# Patient Record
Sex: Female | Born: 1970 | Race: White | Hispanic: No | Marital: Married | Smoking: Never smoker
Health system: Southern US, Community
[De-identification: ages and names within clinical notes are randomized; demographics above are authoritative.]

## PROBLEM LIST (undated history)

## (undated) DIAGNOSIS — Z973 Presence of spectacles and contact lenses: Secondary | ICD-10-CM

## (undated) DIAGNOSIS — K589 Irritable bowel syndrome without diarrhea: Secondary | ICD-10-CM

## (undated) DIAGNOSIS — R29898 Other symptoms and signs involving the musculoskeletal system: Secondary | ICD-10-CM

## (undated) DIAGNOSIS — E039 Hypothyroidism, unspecified: Secondary | ICD-10-CM

## (undated) DIAGNOSIS — D649 Anemia, unspecified: Secondary | ICD-10-CM

## (undated) DIAGNOSIS — F419 Anxiety disorder, unspecified: Secondary | ICD-10-CM

## (undated) DIAGNOSIS — I878 Other specified disorders of veins: Secondary | ICD-10-CM

## (undated) DIAGNOSIS — E0590 Thyrotoxicosis, unspecified without thyrotoxic crisis or storm: Secondary | ICD-10-CM

## (undated) DIAGNOSIS — R1310 Dysphagia, unspecified: Secondary | ICD-10-CM

## (undated) DIAGNOSIS — K219 Gastro-esophageal reflux disease without esophagitis: Secondary | ICD-10-CM

## (undated) DIAGNOSIS — R519 Headache, unspecified: Secondary | ICD-10-CM

## (undated) DIAGNOSIS — J45909 Unspecified asthma, uncomplicated: Secondary | ICD-10-CM

## (undated) DIAGNOSIS — S92301A Fracture of unspecified metatarsal bone(s), right foot, initial encounter for closed fracture: Secondary | ICD-10-CM

## (undated) DIAGNOSIS — G709 Myoneural disorder, unspecified: Secondary | ICD-10-CM

## (undated) DIAGNOSIS — N201 Calculus of ureter: Secondary | ICD-10-CM

## (undated) DIAGNOSIS — K429 Umbilical hernia without obstruction or gangrene: Secondary | ICD-10-CM

## (undated) DIAGNOSIS — M222X9 Patellofemoral disorders, unspecified knee: Secondary | ICD-10-CM

## (undated) DIAGNOSIS — M810 Age-related osteoporosis without current pathological fracture: Secondary | ICD-10-CM

## (undated) DIAGNOSIS — Z8719 Personal history of other diseases of the digestive system: Secondary | ICD-10-CM

## (undated) DIAGNOSIS — M1990 Unspecified osteoarthritis, unspecified site: Secondary | ICD-10-CM

## (undated) DIAGNOSIS — E0500 Thyrotoxicosis with diffuse goiter without thyrotoxic crisis or storm: Secondary | ICD-10-CM

## (undated) HISTORY — DX: Gastro-esophageal reflux disease without esophagitis: K21.9

## (undated) HISTORY — PX: APPENDECTOMY: SHX54

## (undated) HISTORY — DX: Umbilical hernia without obstruction or gangrene: K42.9

## (undated) HISTORY — DX: Anxiety disorder, unspecified: F41.9

## (undated) HISTORY — DX: Thyrotoxicosis, unspecified without thyrotoxic crisis or storm: E05.90

---
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
## 1997-04-13 ENCOUNTER — Inpatient Hospital Stay (HOSPITAL_COMMUNITY): Admission: AD | Admit: 1997-04-13 | Discharge: 1997-04-13 | Payer: Self-pay | Admitting: Gynecology

## 1997-05-25 ENCOUNTER — Inpatient Hospital Stay (HOSPITAL_COMMUNITY): Admission: AD | Admit: 1997-05-25 | Discharge: 1997-05-25 | Payer: Self-pay | Admitting: Gynecology

## 1997-09-06 ENCOUNTER — Ambulatory Visit (HOSPITAL_COMMUNITY): Admission: RE | Admit: 1997-09-06 | Discharge: 1997-09-06 | Payer: Self-pay | Admitting: Family Medicine

## 1997-09-11 ENCOUNTER — Ambulatory Visit (HOSPITAL_COMMUNITY): Admission: RE | Admit: 1997-09-11 | Discharge: 1997-09-11 | Payer: Self-pay | Admitting: Family Medicine

## 1999-03-10 DIAGNOSIS — Z87442 Personal history of urinary calculi: Secondary | ICD-10-CM

## 1999-03-10 HISTORY — DX: Personal history of urinary calculi: Z87.442

## 2000-08-18 ENCOUNTER — Encounter (INDEPENDENT_AMBULATORY_CARE_PROVIDER_SITE_OTHER): Payer: Self-pay | Admitting: Specialist

## 2000-08-18 ENCOUNTER — Other Ambulatory Visit: Admission: RE | Admit: 2000-08-18 | Discharge: 2000-08-18 | Payer: Self-pay | Admitting: Gynecology

## 2000-08-21 ENCOUNTER — Inpatient Hospital Stay (HOSPITAL_COMMUNITY): Admission: AD | Admit: 2000-08-21 | Discharge: 2000-08-21 | Payer: Self-pay | Admitting: Gynecology

## 2000-08-21 ENCOUNTER — Encounter: Payer: Self-pay | Admitting: Gynecology

## 2001-04-28 ENCOUNTER — Other Ambulatory Visit: Admission: RE | Admit: 2001-04-28 | Discharge: 2001-04-28 | Payer: Self-pay | Admitting: Gynecology

## 2002-01-25 ENCOUNTER — Emergency Department (HOSPITAL_COMMUNITY): Admission: EM | Admit: 2002-01-25 | Discharge: 2002-01-25 | Payer: Self-pay | Admitting: Emergency Medicine

## 2002-01-25 ENCOUNTER — Encounter: Payer: Self-pay | Admitting: Emergency Medicine

## 2002-05-15 ENCOUNTER — Other Ambulatory Visit: Admission: RE | Admit: 2002-05-15 | Discharge: 2002-05-15 | Payer: Self-pay | Admitting: Gynecology

## 2002-06-01 ENCOUNTER — Ambulatory Visit (HOSPITAL_COMMUNITY): Admission: RE | Admit: 2002-06-01 | Discharge: 2002-06-01 | Payer: Self-pay | Admitting: Gynecology

## 2002-06-01 ENCOUNTER — Encounter: Payer: Self-pay | Admitting: Gynecology

## 2002-10-10 ENCOUNTER — Encounter: Payer: Self-pay | Admitting: Family Medicine

## 2002-10-10 ENCOUNTER — Encounter: Admission: RE | Admit: 2002-10-10 | Discharge: 2002-10-10 | Payer: Self-pay | Admitting: Family Medicine

## 2004-01-31 ENCOUNTER — Inpatient Hospital Stay (HOSPITAL_COMMUNITY): Admission: AD | Admit: 2004-01-31 | Discharge: 2004-02-02 | Payer: Self-pay | Admitting: Obstetrics & Gynecology

## 2004-08-01 ENCOUNTER — Ambulatory Visit (HOSPITAL_COMMUNITY): Admission: RE | Admit: 2004-08-01 | Discharge: 2004-08-01 | Payer: Self-pay | Admitting: Obstetrics & Gynecology

## 2004-08-12 ENCOUNTER — Encounter: Admission: RE | Admit: 2004-08-12 | Discharge: 2004-08-12 | Payer: Self-pay | Admitting: Obstetrics & Gynecology

## 2005-03-10 ENCOUNTER — Encounter: Admission: RE | Admit: 2005-03-10 | Discharge: 2005-03-10 | Payer: Self-pay | Admitting: Obstetrics & Gynecology

## 2006-07-05 ENCOUNTER — Encounter: Admission: RE | Admit: 2006-07-05 | Discharge: 2006-07-05 | Payer: Self-pay | Admitting: Obstetrics and Gynecology

## 2007-02-07 HISTORY — PX: TONSILLECTOMY: SUR1361

## 2007-08-19 ENCOUNTER — Encounter: Admission: RE | Admit: 2007-08-19 | Discharge: 2007-08-19 | Payer: Self-pay | Admitting: Obstetrics and Gynecology

## 2008-02-07 HISTORY — PX: ENDOMETRIAL ABLATION: SHX621

## 2008-02-27 ENCOUNTER — Ambulatory Visit: Payer: Self-pay | Admitting: Genetic Counselor

## 2008-09-03 ENCOUNTER — Ambulatory Visit (HOSPITAL_COMMUNITY): Admission: RE | Admit: 2008-09-03 | Discharge: 2008-09-03 | Payer: Self-pay | Admitting: Obstetrics

## 2009-09-17 ENCOUNTER — Ambulatory Visit (HOSPITAL_COMMUNITY): Admission: RE | Admit: 2009-09-17 | Discharge: 2009-09-17 | Payer: Self-pay | Admitting: Obstetrics and Gynecology

## 2009-11-14 ENCOUNTER — Encounter: Admission: RE | Admit: 2009-11-14 | Discharge: 2009-11-14 | Payer: Self-pay | Admitting: Endocrinology

## 2010-02-06 HISTORY — PX: HERNIA REPAIR: SHX51

## 2010-03-30 ENCOUNTER — Encounter: Payer: Self-pay | Admitting: Obstetrics & Gynecology

## 2010-03-30 ENCOUNTER — Encounter: Payer: Self-pay | Admitting: Obstetrics and Gynecology

## 2010-07-25 NOTE — Consult Note (Signed)
[facility]  Patient:    Brianna Austin, Brianna Austin                        MRN: [id number] Adm. Date:  [id number] Attending:  Tonye Royalty                          Consultation Report  EMERGENCY ROOM  HISTORY:                      The patient is a 40 year old gravida 2, para 0, AB1 who, a few days ago in the office, had a cervical biopsy.  After that, she was complaining of bleeding in increasing quantity along with some cramping early this morning.  She was advised to come to the emergency room for evaluation. HEr vital signs in the emergency room were as follows: temperature 97.7, pulse 89, respirations 20, blood pressure 153/87.  On pelvic examination, there was evidence of blood in the vaginal vault.  The area for the cervical biopsy was noted at approximately the 4 oclock position, but there was no active bleeding from that site.  The cervix was minimally dilated and small blood clot was present.  The patient was sent to ultrasound for evaluation to confirm intrauterine pregnancy or complete AB.  The uterus measured 8.5 x 4.3 x 5.8 cm with an endometrial strip of 8.5 mm.  Intrauterine pregnancy was seen.  The right and left ovary were normal.  No adnexal abnormality was noted or any free fluid.  The patient also had a quantitative beta hCG of 2027.  Her blood type is A positive.  Her hemoglobin and hematocrit were 13.8 and 40.9 respectively with a platelet count of 243,000. White blood count was 8.1.  Based on all of these findings and the patient not appearing to be in any acute distress, she was released home with threatened AB precautions.  We will get a quantitative beta hCG in 48 hours and see the trend.  She will come to the office at that point.  If her bleeding increases, she will be asked to come to the emergency room.  At that point, we will made the decision to proceed with D&C if indicated.  At this point, conservative management is  warranted in the event of a very early and viable pregnancy. Threatened AB precautions and instructoin sheet were provided for the patient. Of note, the patient does have a history of hypothyroidism, for which she has been under the care of Dr. Criss Alvine.   She is currently taking PTU 10 mg, and she takes 1/2 tablet q.d.  She is also taking prenatal vitamins.  The patient has a history of therapeutic abortion in 1987 without any complications. Instructions were provided to the patient and her husband in writing.  Will follow accordingly. DD:  08/21/00 TD:  08/21/00 Job: [id number]

## 2010-12-03 ENCOUNTER — Other Ambulatory Visit (HOSPITAL_COMMUNITY): Payer: Self-pay | Admitting: Certified Nurse Midwife

## 2010-12-03 DIAGNOSIS — Z1231 Encounter for screening mammogram for malignant neoplasm of breast: Secondary | ICD-10-CM

## 2010-12-17 ENCOUNTER — Ambulatory Visit (HOSPITAL_COMMUNITY)
Admission: RE | Admit: 2010-12-17 | Discharge: 2010-12-17 | Disposition: A | Discharge status: Home / Self Care | Payer: BC Managed Care – PPO | Source: Ambulatory Visit | Attending: Certified Nurse Midwife | Admitting: Certified Nurse Midwife

## 2010-12-17 DIAGNOSIS — Z1231 Encounter for screening mammogram for malignant neoplasm of breast: Secondary | ICD-10-CM

## 2010-12-23 ENCOUNTER — Other Ambulatory Visit: Payer: Self-pay | Admitting: Certified Nurse Midwife

## 2010-12-23 DIAGNOSIS — R928 Other abnormal and inconclusive findings on diagnostic imaging of breast: Secondary | ICD-10-CM

## 2011-01-05 ENCOUNTER — Ambulatory Visit
Admission: RE | Admit: 2011-01-05 | Discharge: 2011-01-05 | Disposition: A | Discharge status: Home / Self Care | Payer: BC Managed Care – PPO | Source: Ambulatory Visit | Attending: Certified Nurse Midwife | Admitting: Certified Nurse Midwife

## 2011-01-05 DIAGNOSIS — R928 Other abnormal and inconclusive findings on diagnostic imaging of breast: Secondary | ICD-10-CM

## 2011-07-17 ENCOUNTER — Encounter: Payer: Self-pay | Admitting: *Deleted

## 2011-11-17 ENCOUNTER — Encounter: Payer: Self-pay | Admitting: Cardiovascular Disease

## 2012-01-19 ENCOUNTER — Other Ambulatory Visit (HOSPITAL_COMMUNITY): Payer: Self-pay | Admitting: Certified Nurse Midwife

## 2012-01-19 DIAGNOSIS — Z1231 Encounter for screening mammogram for malignant neoplasm of breast: Secondary | ICD-10-CM

## 2012-02-26 ENCOUNTER — Ambulatory Visit: Payer: BC Managed Care – PPO

## 2012-03-03 ENCOUNTER — Ambulatory Visit: Payer: BC Managed Care – PPO

## 2012-03-03 ENCOUNTER — Ambulatory Visit
Admission: RE | Admit: 2012-03-03 | Discharge: 2012-03-03 | Disposition: A | Discharge status: Home / Self Care | Payer: BC Managed Care – PPO | Source: Ambulatory Visit | Attending: Certified Nurse Midwife | Admitting: Certified Nurse Midwife

## 2012-03-03 DIAGNOSIS — Z1231 Encounter for screening mammogram for malignant neoplasm of breast: Secondary | ICD-10-CM

## 2012-11-04 ENCOUNTER — Other Ambulatory Visit: Payer: Self-pay | Admitting: Endocrinology

## 2012-11-04 DIAGNOSIS — E049 Nontoxic goiter, unspecified: Secondary | ICD-10-CM

## 2012-11-15 ENCOUNTER — Ambulatory Visit
Admission: RE | Admit: 2012-11-15 | Discharge: 2012-11-15 | Disposition: A | Discharge status: Home / Self Care | Payer: BC Managed Care – PPO | Source: Ambulatory Visit | Attending: Endocrinology | Admitting: Endocrinology

## 2012-11-15 DIAGNOSIS — E049 Nontoxic goiter, unspecified: Secondary | ICD-10-CM

## 2012-11-30 ENCOUNTER — Other Ambulatory Visit: Payer: Self-pay | Admitting: Endocrinology

## 2012-11-30 DIAGNOSIS — E049 Nontoxic goiter, unspecified: Secondary | ICD-10-CM

## 2013-02-07 ENCOUNTER — Other Ambulatory Visit (HOSPITAL_COMMUNITY): Payer: Self-pay | Admitting: Certified Nurse Midwife

## 2013-02-07 DIAGNOSIS — Z1231 Encounter for screening mammogram for malignant neoplasm of breast: Secondary | ICD-10-CM

## 2013-02-22 ENCOUNTER — Ambulatory Visit (HOSPITAL_COMMUNITY)
Admission: RE | Admit: 2013-02-22 | Discharge: 2013-02-22 | Disposition: A | Discharge status: Home / Self Care | Payer: BC Managed Care – PPO | Source: Ambulatory Visit | Attending: Certified Nurse Midwife | Admitting: Certified Nurse Midwife

## 2013-02-22 DIAGNOSIS — Z1231 Encounter for screening mammogram for malignant neoplasm of breast: Secondary | ICD-10-CM | POA: Insufficient documentation

## 2013-10-16 ENCOUNTER — Ambulatory Visit
Admission: RE | Admit: 2013-10-16 | Discharge: 2013-10-16 | Disposition: A | Discharge status: Home / Self Care | Payer: BC Managed Care – PPO | Source: Ambulatory Visit | Attending: Endocrinology | Admitting: Endocrinology

## 2013-10-16 DIAGNOSIS — E049 Nontoxic goiter, unspecified: Secondary | ICD-10-CM

## 2014-02-09 ENCOUNTER — Other Ambulatory Visit (HOSPITAL_COMMUNITY): Payer: Self-pay | Admitting: Certified Nurse Midwife

## 2014-02-09 DIAGNOSIS — Z1231 Encounter for screening mammogram for malignant neoplasm of breast: Secondary | ICD-10-CM

## 2014-02-28 ENCOUNTER — Ambulatory Visit (HOSPITAL_COMMUNITY)
Admission: RE | Admit: 2014-02-28 | Discharge: 2014-02-28 | Disposition: A | Discharge status: Home / Self Care | Payer: BC Managed Care – PPO | Source: Ambulatory Visit | Attending: Certified Nurse Midwife | Admitting: Certified Nurse Midwife

## 2014-02-28 DIAGNOSIS — Z1231 Encounter for screening mammogram for malignant neoplasm of breast: Secondary | ICD-10-CM | POA: Insufficient documentation

## 2014-11-13 ENCOUNTER — Other Ambulatory Visit: Payer: Self-pay | Admitting: Endocrinology

## 2014-11-13 DIAGNOSIS — E041 Nontoxic single thyroid nodule: Secondary | ICD-10-CM

## 2015-01-24 ENCOUNTER — Other Ambulatory Visit: Payer: Self-pay

## 2015-01-24 DIAGNOSIS — Z1231 Encounter for screening mammogram for malignant neoplasm of breast: Secondary | ICD-10-CM

## 2015-01-28 ENCOUNTER — Other Ambulatory Visit: Payer: Self-pay | Admitting: Endocrinology

## 2015-01-28 DIAGNOSIS — N959 Unspecified menopausal and perimenopausal disorder: Secondary | ICD-10-CM

## 2015-01-30 ENCOUNTER — Ambulatory Visit
Admission: RE | Admit: 2015-01-30 | Discharge: 2015-01-30 | Disposition: A | Discharge status: Home / Self Care | Payer: BLUE CROSS/BLUE SHIELD | Source: Ambulatory Visit | Attending: Endocrinology | Admitting: Endocrinology

## 2015-01-30 DIAGNOSIS — N959 Unspecified menopausal and perimenopausal disorder: Secondary | ICD-10-CM

## 2015-03-05 ENCOUNTER — Ambulatory Visit
Admission: RE | Admit: 2015-03-05 | Discharge: 2015-03-05 | Disposition: A | Discharge status: Home / Self Care | Payer: BLUE CROSS/BLUE SHIELD | Source: Ambulatory Visit

## 2015-03-05 DIAGNOSIS — Z1231 Encounter for screening mammogram for malignant neoplasm of breast: Secondary | ICD-10-CM

## 2015-11-06 ENCOUNTER — Ambulatory Visit
Admission: RE | Admit: 2015-11-06 | Discharge: 2015-11-06 | Disposition: A | Discharge status: Home / Self Care | Payer: 59 | Source: Ambulatory Visit | Attending: Endocrinology | Admitting: Endocrinology

## 2015-11-06 DIAGNOSIS — E041 Nontoxic single thyroid nodule: Secondary | ICD-10-CM

## 2016-02-14 ENCOUNTER — Other Ambulatory Visit: Payer: Self-pay | Admitting: Certified Nurse Midwife

## 2016-02-14 DIAGNOSIS — Z1231 Encounter for screening mammogram for malignant neoplasm of breast: Secondary | ICD-10-CM

## 2016-03-16 ENCOUNTER — Ambulatory Visit
Admission: RE | Admit: 2016-03-16 | Discharge: 2016-03-16 | Disposition: A | Discharge status: Home / Self Care | Payer: BLUE CROSS/BLUE SHIELD | Source: Ambulatory Visit | Attending: Certified Nurse Midwife | Admitting: Certified Nurse Midwife

## 2016-03-16 DIAGNOSIS — Z1231 Encounter for screening mammogram for malignant neoplasm of breast: Secondary | ICD-10-CM | POA: Diagnosis not present

## 2016-04-08 DIAGNOSIS — E611 Iron deficiency: Secondary | ICD-10-CM | POA: Diagnosis not present

## 2016-04-08 DIAGNOSIS — Z0000 Encounter for general adult medical examination without abnormal findings: Secondary | ICD-10-CM | POA: Diagnosis not present

## 2016-04-08 DIAGNOSIS — M1990 Unspecified osteoarthritis, unspecified site: Secondary | ICD-10-CM | POA: Diagnosis not present

## 2016-04-08 DIAGNOSIS — E039 Hypothyroidism, unspecified: Secondary | ICD-10-CM | POA: Diagnosis not present

## 2016-04-08 DIAGNOSIS — E559 Vitamin D deficiency, unspecified: Secondary | ICD-10-CM | POA: Diagnosis not present

## 2016-04-08 DIAGNOSIS — E785 Hyperlipidemia, unspecified: Secondary | ICD-10-CM | POA: Diagnosis not present

## 2016-04-10 ENCOUNTER — Other Ambulatory Visit: Payer: Self-pay | Admitting: Family Medicine

## 2016-04-10 DIAGNOSIS — R1011 Right upper quadrant pain: Secondary | ICD-10-CM

## 2016-04-20 ENCOUNTER — Ambulatory Visit
Admission: RE | Admit: 2016-04-20 | Discharge: 2016-04-20 | Disposition: A | Discharge status: Home / Self Care | Payer: BLUE CROSS/BLUE SHIELD | Source: Ambulatory Visit | Attending: Family Medicine | Admitting: Family Medicine

## 2016-04-20 DIAGNOSIS — R1011 Right upper quadrant pain: Secondary | ICD-10-CM

## 2016-04-20 DIAGNOSIS — K7689 Other specified diseases of liver: Secondary | ICD-10-CM | POA: Diagnosis not present

## 2016-05-07 DIAGNOSIS — K5904 Chronic idiopathic constipation: Secondary | ICD-10-CM | POA: Diagnosis not present

## 2016-05-07 DIAGNOSIS — R079 Chest pain, unspecified: Secondary | ICD-10-CM | POA: Diagnosis not present

## 2016-05-07 DIAGNOSIS — R140 Abdominal distension (gaseous): Secondary | ICD-10-CM | POA: Diagnosis not present

## 2016-05-07 DIAGNOSIS — K219 Gastro-esophageal reflux disease without esophagitis: Secondary | ICD-10-CM | POA: Diagnosis not present

## 2016-09-07 DIAGNOSIS — Z6828 Body mass index (BMI) 28.0-28.9, adult: Secondary | ICD-10-CM | POA: Diagnosis not present

## 2016-09-07 DIAGNOSIS — Z01419 Encounter for gynecological examination (general) (routine) without abnormal findings: Secondary | ICD-10-CM | POA: Diagnosis not present

## 2016-09-07 DIAGNOSIS — Z1151 Encounter for screening for human papillomavirus (HPV): Secondary | ICD-10-CM | POA: Diagnosis not present

## 2017-01-19 DIAGNOSIS — E559 Vitamin D deficiency, unspecified: Secondary | ICD-10-CM | POA: Diagnosis not present

## 2017-01-19 DIAGNOSIS — E039 Hypothyroidism, unspecified: Secondary | ICD-10-CM | POA: Diagnosis not present

## 2017-01-25 DIAGNOSIS — E039 Hypothyroidism, unspecified: Secondary | ICD-10-CM | POA: Diagnosis not present

## 2017-01-25 DIAGNOSIS — E041 Nontoxic single thyroid nodule: Secondary | ICD-10-CM | POA: Diagnosis not present

## 2017-01-25 DIAGNOSIS — M8580 Other specified disorders of bone density and structure, unspecified site: Secondary | ICD-10-CM | POA: Diagnosis not present

## 2017-01-25 DIAGNOSIS — E559 Vitamin D deficiency, unspecified: Secondary | ICD-10-CM | POA: Diagnosis not present

## 2017-04-06 ENCOUNTER — Other Ambulatory Visit: Payer: Self-pay | Admitting: Certified Nurse Midwife

## 2017-04-06 DIAGNOSIS — R5383 Other fatigue: Secondary | ICD-10-CM | POA: Diagnosis not present

## 2017-04-06 DIAGNOSIS — E559 Vitamin D deficiency, unspecified: Secondary | ICD-10-CM | POA: Diagnosis not present

## 2017-04-06 DIAGNOSIS — Z0000 Encounter for general adult medical examination without abnormal findings: Secondary | ICD-10-CM | POA: Diagnosis not present

## 2017-04-06 DIAGNOSIS — E039 Hypothyroidism, unspecified: Secondary | ICD-10-CM | POA: Diagnosis not present

## 2017-04-06 DIAGNOSIS — Z1231 Encounter for screening mammogram for malignant neoplasm of breast: Secondary | ICD-10-CM

## 2017-04-06 DIAGNOSIS — E785 Hyperlipidemia, unspecified: Secondary | ICD-10-CM | POA: Diagnosis not present

## 2017-04-08 ENCOUNTER — Ambulatory Visit
Admission: RE | Admit: 2017-04-08 | Discharge: 2017-04-08 | Disposition: A | Discharge status: Home / Self Care | Payer: BLUE CROSS/BLUE SHIELD | Source: Ambulatory Visit | Attending: Certified Nurse Midwife | Admitting: Certified Nurse Midwife

## 2017-04-08 DIAGNOSIS — Z1231 Encounter for screening mammogram for malignant neoplasm of breast: Secondary | ICD-10-CM | POA: Diagnosis not present

## 2017-04-19 DIAGNOSIS — H01009 Unspecified blepharitis unspecified eye, unspecified eyelid: Secondary | ICD-10-CM | POA: Diagnosis not present

## 2017-04-19 DIAGNOSIS — D485 Neoplasm of uncertain behavior of skin: Secondary | ICD-10-CM | POA: Diagnosis not present

## 2017-05-10 DIAGNOSIS — L280 Lichen simplex chronicus: Secondary | ICD-10-CM | POA: Diagnosis not present

## 2017-05-24 DIAGNOSIS — E039 Hypothyroidism, unspecified: Secondary | ICD-10-CM | POA: Diagnosis not present

## 2017-07-09 DIAGNOSIS — R600 Localized edema: Secondary | ICD-10-CM | POA: Diagnosis not present

## 2017-07-09 DIAGNOSIS — R079 Chest pain, unspecified: Secondary | ICD-10-CM | POA: Diagnosis not present

## 2017-07-09 DIAGNOSIS — R0601 Orthopnea: Secondary | ICD-10-CM | POA: Diagnosis not present

## 2017-07-27 DIAGNOSIS — E039 Hypothyroidism, unspecified: Secondary | ICD-10-CM | POA: Diagnosis not present

## 2017-09-07 ENCOUNTER — Telehealth: Payer: Self-pay

## 2017-09-07 NOTE — Telephone Encounter (Signed)
Attached notes to July File.

## 2017-09-22 ENCOUNTER — Ambulatory Visit: Payer: BLUE CROSS/BLUE SHIELD | Admitting: Cardiovascular Disease

## 2017-10-15 DIAGNOSIS — E0590 Thyrotoxicosis, unspecified without thyrotoxic crisis or storm: Secondary | ICD-10-CM | POA: Diagnosis not present

## 2017-10-15 DIAGNOSIS — R3989 Other symptoms and signs involving the genitourinary system: Secondary | ICD-10-CM | POA: Diagnosis not present

## 2017-10-15 DIAGNOSIS — Z01419 Encounter for gynecological examination (general) (routine) without abnormal findings: Secondary | ICD-10-CM | POA: Diagnosis not present

## 2017-10-15 DIAGNOSIS — I890 Lymphedema, not elsewhere classified: Secondary | ICD-10-CM | POA: Diagnosis not present

## 2017-12-03 DIAGNOSIS — M19071 Primary osteoarthritis, right ankle and foot: Secondary | ICD-10-CM | POA: Diagnosis not present

## 2017-12-03 DIAGNOSIS — M7751 Other enthesopathy of right foot: Secondary | ICD-10-CM | POA: Diagnosis not present

## 2017-12-20 ENCOUNTER — Other Ambulatory Visit: Payer: Self-pay

## 2017-12-20 DIAGNOSIS — I878 Other specified disorders of veins: Secondary | ICD-10-CM

## 2018-01-18 DIAGNOSIS — E041 Nontoxic single thyroid nodule: Secondary | ICD-10-CM | POA: Diagnosis not present

## 2018-01-18 DIAGNOSIS — E559 Vitamin D deficiency, unspecified: Secondary | ICD-10-CM | POA: Diagnosis not present

## 2018-01-25 DIAGNOSIS — E039 Hypothyroidism, unspecified: Secondary | ICD-10-CM | POA: Diagnosis not present

## 2018-01-25 DIAGNOSIS — E041 Nontoxic single thyroid nodule: Secondary | ICD-10-CM | POA: Diagnosis not present

## 2018-01-25 DIAGNOSIS — M8580 Other specified disorders of bone density and structure, unspecified site: Secondary | ICD-10-CM | POA: Diagnosis not present

## 2018-01-25 DIAGNOSIS — E559 Vitamin D deficiency, unspecified: Secondary | ICD-10-CM | POA: Diagnosis not present

## 2018-01-28 ENCOUNTER — Other Ambulatory Visit: Payer: Self-pay | Admitting: Endocrinology

## 2018-01-28 DIAGNOSIS — M8580 Other specified disorders of bone density and structure, unspecified site: Secondary | ICD-10-CM

## 2018-02-14 ENCOUNTER — Ambulatory Visit (INDEPENDENT_AMBULATORY_CARE_PROVIDER_SITE_OTHER): Payer: BLUE CROSS/BLUE SHIELD | Admitting: Surgery

## 2018-02-14 ENCOUNTER — Encounter

## 2018-02-14 ENCOUNTER — Encounter: Payer: Self-pay | Admitting: Surgery

## 2018-02-14 ENCOUNTER — Ambulatory Visit (HOSPITAL_COMMUNITY)
Admission: RE | Admit: 2018-02-14 | Discharge: 2018-02-14 | Disposition: A | Discharge status: Home / Self Care | Payer: BLUE CROSS/BLUE SHIELD | Source: Ambulatory Visit | Attending: Surgery | Admitting: Surgery

## 2018-02-14 ENCOUNTER — Other Ambulatory Visit: Payer: Self-pay

## 2018-02-14 VITALS — BP 113/80 | HR 102 | Temp 97.7°F | Resp 14 | Ht 65.0 in | Wt 178.0 lb

## 2018-02-14 DIAGNOSIS — I878 Other specified disorders of veins: Secondary | ICD-10-CM | POA: Diagnosis not present

## 2018-02-14 NOTE — Progress Notes (Signed)
[facility] [place]  Patient name: Brianna FarberDana M Austin MRN: [id number] DOB: 1970/05/15 Sex: female   REQUESTING PROVIDER:    Dr. Zachary Georgeomaszewski   REASON FOR CONSULT:    Leg swelling  HISTORY OF PRESENT ILLNESS:   Brianna Austin is a 47 y.o. female, who is referred today for further evaluation of leg pain and swelling.  She states this is been going on for approximately 10 months.  Her biggest problem is when she goes from a resting position to standing.  She gets severe pain.  She can walk for a while and it will resolve.  She says she gets sporadic swelling.  She denies a history of DVT in the past.  She denies any clotting disorders.  She does not have any open wounds.  The patient has a family history of phlebitis in her grandmother and her aunt.  She is treated for hyperthyroidism  PAST MEDICAL HISTORY    Past Medical History:  Diagnosis Date  . Anxiety   . Chest pain   . GERD (gastroesophageal reflux disease)   . Hyperthyroidism   . Umbilical hernia      FAMILY HISTORY   Family History  Problem Relation Age of Onset  . Glaucoma Unknown   . Hyperlipidemia Unknown   . Breast cancer Unknown   . Graves' disease Unknown   . Hypertension Unknown   . Asthma Unknown   . GER disease Unknown     SOCIAL HISTORY:   Social History   Socioeconomic History  . Marital status: Married    Spouse name: Not on file  . Number of children: 1  . Years of education: Not on file  . Highest education level: Not on file  Occupational History  . Occupation: IT trainerCPA -- self employed  Social Needs  . Financial resource strain: Not on file  . Food insecurity:    Worry: Not on file    Inability: Not on file  . Transportation needs:    Medical: Not on file    Non-medical: Not on file  Tobacco Use  . Smoking status: Never Smoker  Substance and Sexual Activity  . Alcohol use: Yes    Comment: 2 monthly  . Drug use: Not on file  .  Sexual activity: Not on file  Lifestyle  . Physical activity:    Days per week: Not on file    Minutes per session: Not on file  . Stress: Not on file  Relationships  . Social connections:    Talks on phone: Not on file    Gets together: Not on file    Attends religious service: Not on file    Active member of club or organization: Not on file    Attends meetings of clubs or organizations: Not on file    Relationship status: Not on file  . Intimate partner violence:    Fear of current or ex partner: Not on file    Emotionally abused: Not on file    Physically abused: Not on file    Forced sexual activity: Not on file  Other Topics Concern  . Not on file  Social History Narrative  . Not on file    ALLERGIES:    Allergies  Allergen Reactions  . Ambien [Zolpidem Tartrate] Other (See Comments)    hallucinations    CURRENT MEDICATIONS:    Current Outpatient Medications  Medication Sig Dispense Refill  . Multiple Vitamin (MULTIVITAMIN) tablet Take 1 tablet by mouth  daily.    . Olopatadine HCl (PATANASE) 0.6 % SOLN Place into the nose as needed.    . ranitidine (ZANTAC) 150 MG tablet Take 150 mg by mouth as needed.     No current facility-administered medications for this visit.     REVIEW OF SYSTEMS:   [X]  denotes positive finding, [ ]  denotes negative finding Cardiac  Comments:  Chest pain or chest pressure:    Shortness of breath upon exertion:    Short of breath when lying flat:    Irregular heart rhythm:        Vascular    Pain in calf, thigh, or hip brought on by ambulation:    Pain in feet at night that wakes you up from your sleep:  x   Blood clot in your veins:    Leg swelling:  x       Pulmonary    Oxygen at home:    Productive cough:     Wheezing:         Neurologic    Sudden weakness in arms or legs:     Sudden numbness in arms or legs:     Sudden onset of difficulty speaking or slurred speech:    Temporary loss of vision in one eye:       Problems with dizziness:         Gastrointestinal    Blood in stool:      Vomited blood:         Genitourinary    Burning when urinating:     Blood in urine:        Psychiatric    Major depression:         Hematologic    Bleeding problems:    Problems with blood clotting too easily:        Skin    Rashes or ulcers:        Constitutional    Fever or chills:     PHYSICAL EXAM:   There were no vitals filed for this visit.  GENERAL: The patient is a well-nourished female, in no acute distress. The vital signs are documented above. CARDIAC: There is a regular rate and rhythm.  VASCULAR: Bilateral edema PULMONARY: Nonlabored respirations MUSCULOSKELETAL: There are no major deformities or cyanosis. NEUROLOGIC: No focal weakness or paresthesias are detected. SKIN: There are no ulcers or rashes noted. PSYCHIATRIC: The patient has a normal affect.  STUDIES:   I have ordered and reviewed her vascular studies with the following findings: Venous Reflux Times Normal value < 0.5 sec +------------------------------+----------+---------+                Right (ms)Left (ms) +------------------------------+----------+---------+ CFV              1042.00        +------------------------------+----------+---------+ FV              2457.00        +------------------------------+----------+---------+ Popliteal           1445.00        +------------------------------+----------+---------+ GSV at Saphenofemoral junction3565.00  1320.00  +------------------------------+----------+---------+ GSV prox thigh        2000.00  1298.00  +------------------------------+----------+---------+ GSV mid thigh         3469.00        +------------------------------+----------+---------+ GSV at knee          2567.00  5127.00   +------------------------------+----------+---------+  Vein Diameters: +------------------------------+----------+---------+  Right (cm)Left (cm) +------------------------------+----------+---------+ GSV at Saphenofemoral junction0.96   .83    +------------------------------+----------+---------+ GSV at prox thigh       0.895   .68    +------------------------------+----------+---------+ GSV at mid thigh       0.639   .56    +------------------------------+----------+---------+ GSV at distal thigh      0.639   .55    +------------------------------+----------+---------+ GSV at knee          0.639   .62    +------------------------------+----------+---------+ GSV prox calf         0.475   .52    +------------------------------+----------+---------+ GSV mid calf         0.310   .34    +------------------------------+----------+---------+ SSV origin          0.27   .29    +------------------------------+----------+---------+ SSV prox           0.256   .22 / .61 +------------------------------+----------+---------+ SSV mid            0.329   .34 / .21 +------------------------------+----------+---------+     Summary: Right: Abnormal reflux times were noted in the common femoral vein, femoral vein in the thigh, popliteal vein, great saphenous vein at the saphenofemoral junction, great saphenous vein at the proximal thigh, great saphenous vein at the mid thigh, and  great saphenous vein at the knee. Left: Abnormal reflux times were noted in the great saphenous vein at the saphenofemoral junction, great saphenous vein at the proximal thigh, and great saphenous vein at the knee.   No DVT bilaterally from the common femoral vein to the popliteal vein.   ASSESSMENT and PLAN   Bilateral  leg swelling, right greater than left: The patient's ultrasound today shows superficial and deep venous insufficiency bilaterally.  The superficial component is worse in the right leg which is her more symptomatic leg.  I discussed with the patient that we should try conservative measures first to see if she can get any improvement.  We will place her in 20-30 thigh-high compression stockings.  Have also encouraged her to keep her legs elevated when possible.  She will return in 3 months for further discussions as to whether or not laser ablation would be beneficial.   Durene Cal, MD [facility] [phone] Pager [phone]

## 2018-02-24 ENCOUNTER — Other Ambulatory Visit: Payer: Self-pay | Admitting: Certified Nurse Midwife

## 2018-02-24 DIAGNOSIS — Z1231 Encounter for screening mammogram for malignant neoplasm of breast: Secondary | ICD-10-CM

## 2018-03-09 DIAGNOSIS — Z87898 Personal history of other specified conditions: Secondary | ICD-10-CM

## 2018-03-09 HISTORY — DX: Personal history of other specified conditions: Z87.898

## 2018-04-18 ENCOUNTER — Ambulatory Visit
Admission: RE | Admit: 2018-04-18 | Discharge: 2018-04-18 | Disposition: A | Discharge status: Home / Self Care | Payer: BLUE CROSS/BLUE SHIELD | Source: Ambulatory Visit | Attending: Endocrinology | Admitting: Endocrinology

## 2018-04-18 ENCOUNTER — Ambulatory Visit
Admission: RE | Admit: 2018-04-18 | Discharge: 2018-04-18 | Disposition: A | Discharge status: Home / Self Care | Payer: BLUE CROSS/BLUE SHIELD | Source: Ambulatory Visit | Attending: Certified Nurse Midwife | Admitting: Certified Nurse Midwife

## 2018-04-18 DIAGNOSIS — M8580 Other specified disorders of bone density and structure, unspecified site: Secondary | ICD-10-CM

## 2018-04-18 DIAGNOSIS — Z1231 Encounter for screening mammogram for malignant neoplasm of breast: Secondary | ICD-10-CM

## 2018-04-25 DIAGNOSIS — E039 Hypothyroidism, unspecified: Secondary | ICD-10-CM | POA: Diagnosis not present

## 2018-04-25 DIAGNOSIS — Z5181 Encounter for therapeutic drug level monitoring: Secondary | ICD-10-CM | POA: Diagnosis not present

## 2018-04-25 DIAGNOSIS — E559 Vitamin D deficiency, unspecified: Secondary | ICD-10-CM | POA: Diagnosis not present

## 2018-04-25 DIAGNOSIS — Z0000 Encounter for general adult medical examination without abnormal findings: Secondary | ICD-10-CM | POA: Diagnosis not present

## 2018-04-25 DIAGNOSIS — E785 Hyperlipidemia, unspecified: Secondary | ICD-10-CM | POA: Diagnosis not present

## 2018-04-26 ENCOUNTER — Other Ambulatory Visit (HOSPITAL_COMMUNITY): Payer: Self-pay | Admitting: Family Medicine

## 2018-04-26 ENCOUNTER — Other Ambulatory Visit: Payer: Self-pay | Admitting: Family Medicine

## 2018-04-26 DIAGNOSIS — R002 Palpitations: Secondary | ICD-10-CM

## 2018-04-29 ENCOUNTER — Encounter: Payer: Self-pay | Admitting: Family Medicine

## 2018-05-03 ENCOUNTER — Ambulatory Visit (HOSPITAL_COMMUNITY): Payer: BLUE CROSS/BLUE SHIELD | Attending: Cardiovascular Disease

## 2018-05-03 DIAGNOSIS — R002 Palpitations: Secondary | ICD-10-CM

## 2018-06-01 ENCOUNTER — Ambulatory Visit: Payer: BLUE CROSS/BLUE SHIELD | Admitting: Vascular Surgery

## 2019-01-19 DIAGNOSIS — E039 Hypothyroidism, unspecified: Secondary | ICD-10-CM | POA: Diagnosis not present

## 2019-01-19 DIAGNOSIS — E559 Vitamin D deficiency, unspecified: Secondary | ICD-10-CM | POA: Diagnosis not present

## 2019-01-26 DIAGNOSIS — N200 Calculus of kidney: Secondary | ICD-10-CM | POA: Diagnosis not present

## 2019-01-26 DIAGNOSIS — E559 Vitamin D deficiency, unspecified: Secondary | ICD-10-CM | POA: Diagnosis not present

## 2019-01-26 DIAGNOSIS — E039 Hypothyroidism, unspecified: Secondary | ICD-10-CM | POA: Diagnosis not present

## 2019-01-26 DIAGNOSIS — M8580 Other specified disorders of bone density and structure, unspecified site: Secondary | ICD-10-CM | POA: Diagnosis not present

## 2019-02-15 DIAGNOSIS — N200 Calculus of kidney: Secondary | ICD-10-CM | POA: Diagnosis not present

## 2019-04-12 ENCOUNTER — Other Ambulatory Visit: Payer: Self-pay | Admitting: Family Medicine

## 2019-04-12 DIAGNOSIS — Z1231 Encounter for screening mammogram for malignant neoplasm of breast: Secondary | ICD-10-CM

## 2019-04-24 DIAGNOSIS — E039 Hypothyroidism, unspecified: Secondary | ICD-10-CM | POA: Diagnosis not present

## 2019-05-22 DIAGNOSIS — E785 Hyperlipidemia, unspecified: Secondary | ICD-10-CM | POA: Diagnosis not present

## 2019-05-22 DIAGNOSIS — E039 Hypothyroidism, unspecified: Secondary | ICD-10-CM | POA: Diagnosis not present

## 2019-05-22 DIAGNOSIS — Z0000 Encounter for general adult medical examination without abnormal findings: Secondary | ICD-10-CM | POA: Diagnosis not present

## 2019-05-22 DIAGNOSIS — Z5181 Encounter for therapeutic drug level monitoring: Secondary | ICD-10-CM | POA: Diagnosis not present

## 2019-05-23 ENCOUNTER — Ambulatory Visit
Admission: RE | Admit: 2019-05-23 | Discharge: 2019-05-23 | Disposition: A | Discharge status: Home / Self Care | Payer: BC Managed Care – PPO | Source: Ambulatory Visit | Attending: Family Medicine | Admitting: Family Medicine

## 2019-05-23 ENCOUNTER — Other Ambulatory Visit: Payer: Self-pay

## 2019-05-23 DIAGNOSIS — Z1231 Encounter for screening mammogram for malignant neoplasm of breast: Secondary | ICD-10-CM | POA: Diagnosis not present

## 2019-05-23 DIAGNOSIS — Z1211 Encounter for screening for malignant neoplasm of colon: Secondary | ICD-10-CM | POA: Diagnosis not present

## 2020-01-18 DIAGNOSIS — N200 Calculus of kidney: Secondary | ICD-10-CM | POA: Diagnosis not present

## 2020-01-18 DIAGNOSIS — E559 Vitamin D deficiency, unspecified: Secondary | ICD-10-CM | POA: Diagnosis not present

## 2020-01-18 DIAGNOSIS — E039 Hypothyroidism, unspecified: Secondary | ICD-10-CM | POA: Diagnosis not present

## 2020-01-25 DIAGNOSIS — N200 Calculus of kidney: Secondary | ICD-10-CM | POA: Diagnosis not present

## 2020-01-25 DIAGNOSIS — E559 Vitamin D deficiency, unspecified: Secondary | ICD-10-CM | POA: Diagnosis not present

## 2020-01-25 DIAGNOSIS — E039 Hypothyroidism, unspecified: Secondary | ICD-10-CM | POA: Diagnosis not present

## 2020-01-25 DIAGNOSIS — M810 Age-related osteoporosis without current pathological fracture: Secondary | ICD-10-CM | POA: Diagnosis not present

## 2020-02-16 IMAGING — MG DIGITAL SCREENING BILATERAL MAMMOGRAM WITH TOMO AND CAD
8 series · 8 of 24 positions shown · non-contrast
Comparison: Previous exam(s).

CLINICAL DATA: Screening.

EXAM:
DIGITAL SCREENING BILATERAL MAMMOGRAM WITH TOMO AND CAD

[L MLO synth-2D]
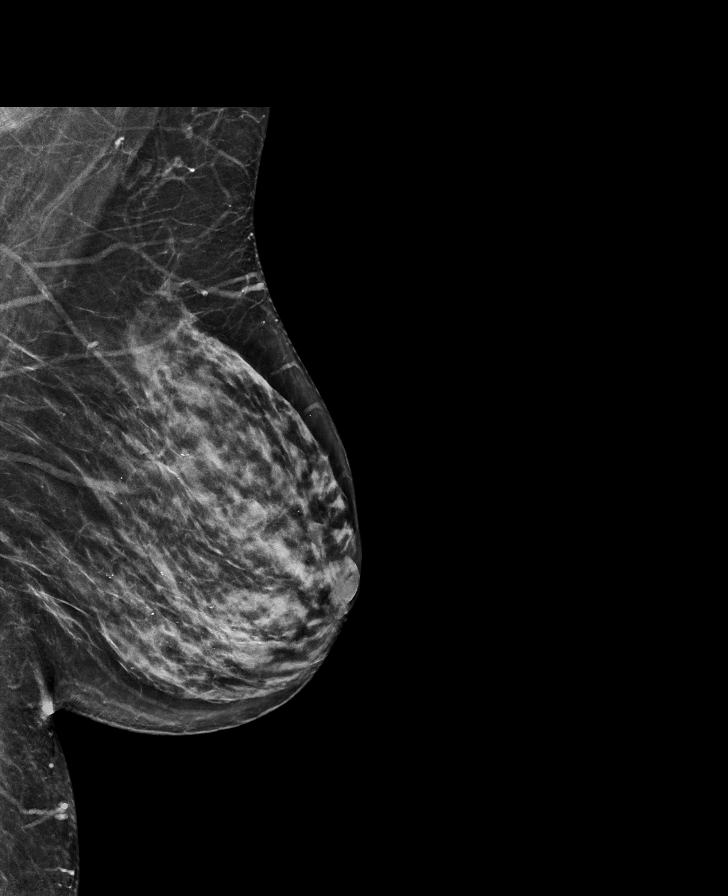

[R MLO synth-2D]
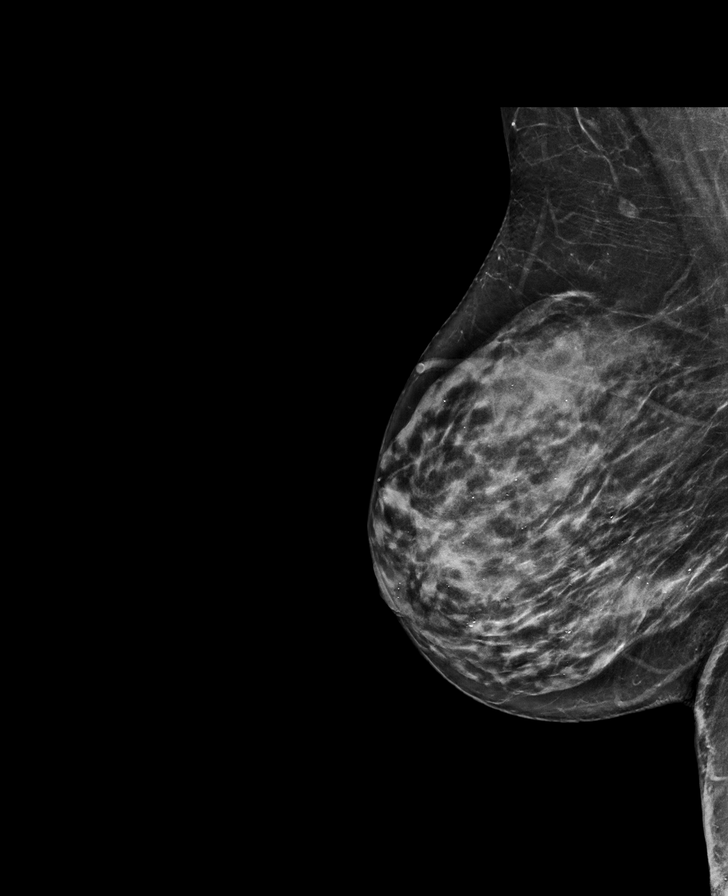

[R CC synth-2D]
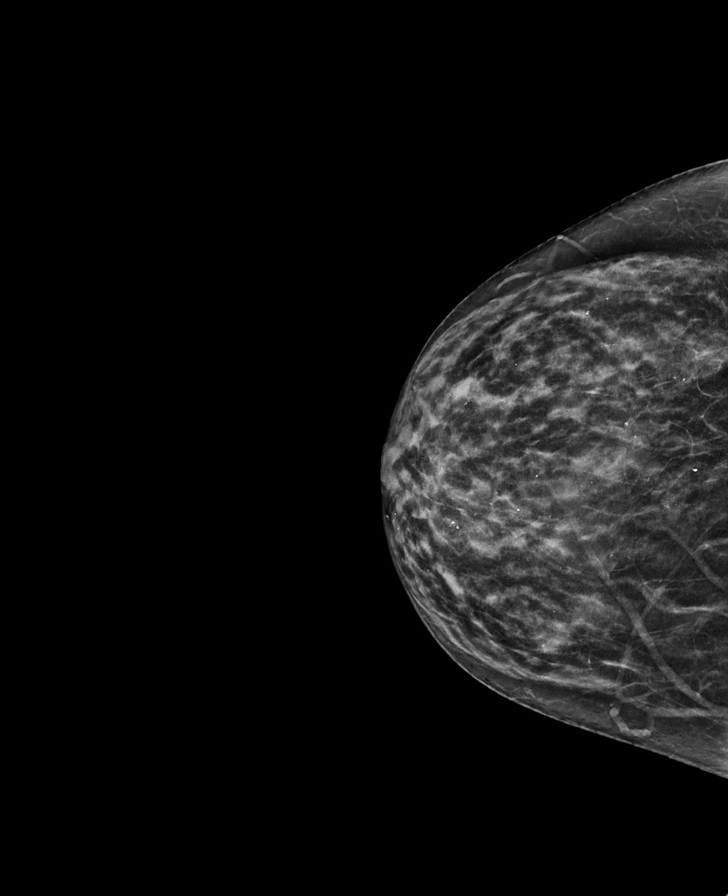

[L CC synth-2D]
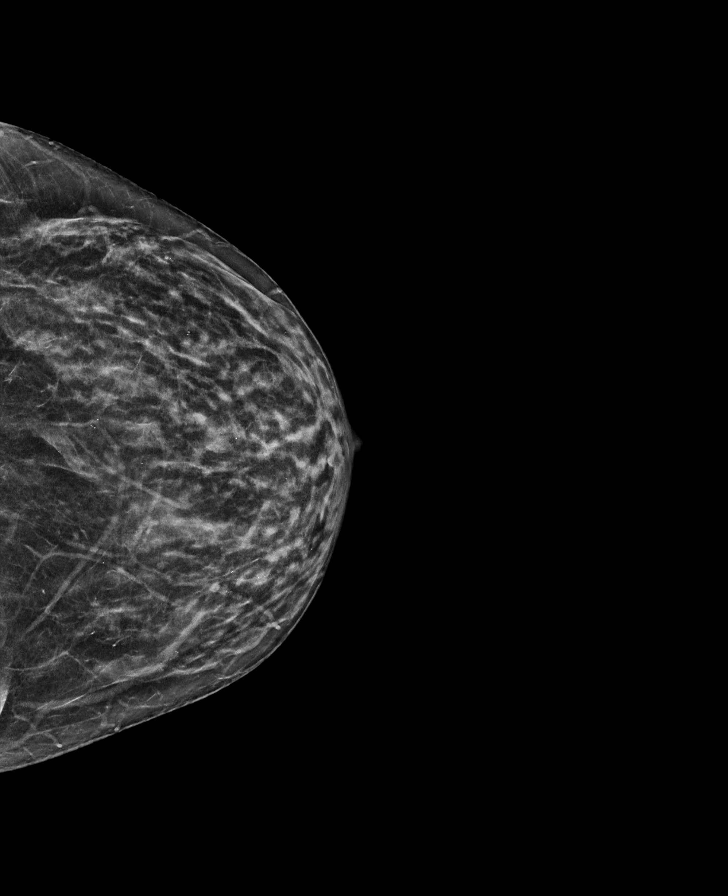

[L CC tomo · tomo slice 29/57.0]
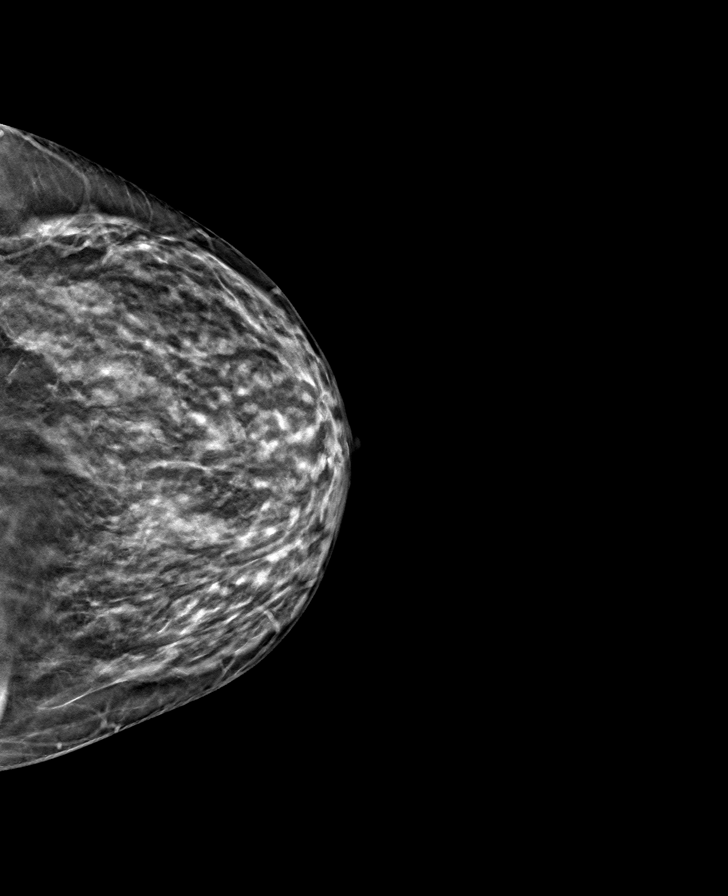

[R MLO tomo · tomo slice 34/67.0]
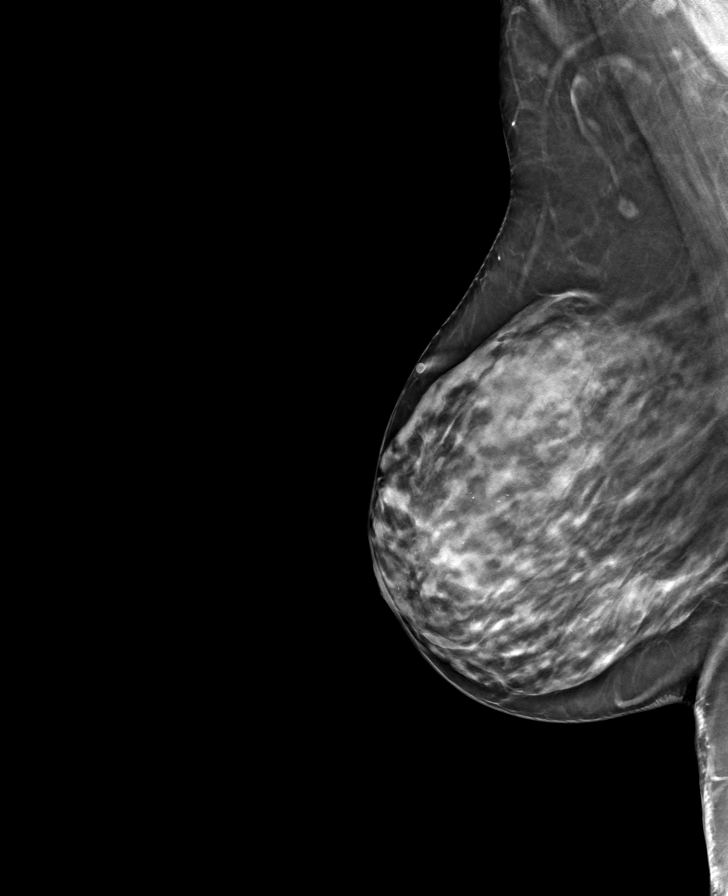

[R CC tomo · tomo slice 29/57.0]
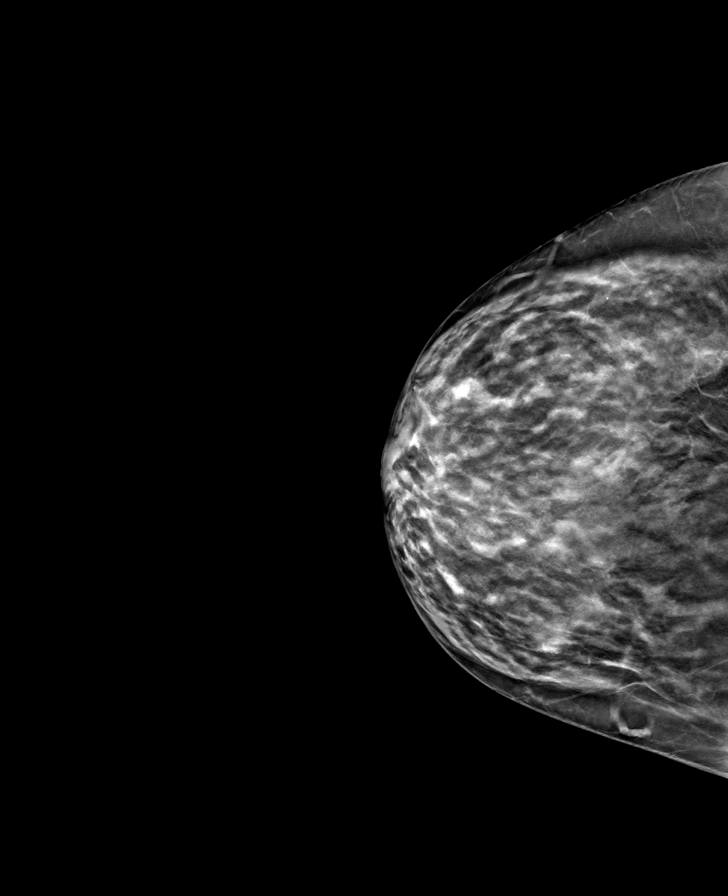

[L MLO tomo · tomo slice 35/69.0]
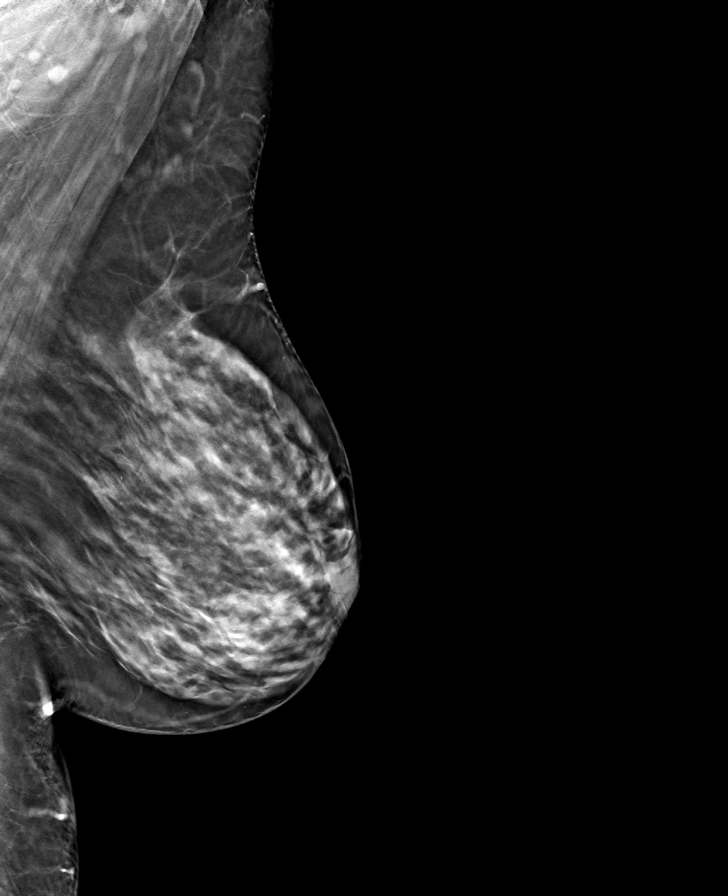

[8 of 24 positions shown; findings below may reference images not displayed]

ACR Breast Density Category c: The breast tissue is heterogeneously
dense, which may obscure small masses.
FINDINGS: There are no findings suspicious for malignancy. Images were
processed with CAD.
IMPRESSION: No mammographic evidence of malignancy. A result letter of this
screening mammogram will be mailed directly to the patient.

RECOMMENDATION:
Screening mammogram in one year. (Code:[id number]-U-LHB)

BI-RADS CATEGORY  1: Negative.

## 2020-04-22 ENCOUNTER — Other Ambulatory Visit: Payer: Self-pay | Admitting: Family Medicine

## 2020-04-22 DIAGNOSIS — Z1231 Encounter for screening mammogram for malignant neoplasm of breast: Secondary | ICD-10-CM

## 2020-04-23 DIAGNOSIS — K219 Gastro-esophageal reflux disease without esophagitis: Secondary | ICD-10-CM | POA: Diagnosis not present

## 2020-04-23 DIAGNOSIS — E669 Obesity, unspecified: Secondary | ICD-10-CM | POA: Diagnosis not present

## 2020-04-23 DIAGNOSIS — R1033 Periumbilical pain: Secondary | ICD-10-CM | POA: Diagnosis not present

## 2020-04-23 DIAGNOSIS — K5904 Chronic idiopathic constipation: Secondary | ICD-10-CM | POA: Diagnosis not present

## 2020-04-23 DIAGNOSIS — Z1211 Encounter for screening for malignant neoplasm of colon: Secondary | ICD-10-CM | POA: Diagnosis not present

## 2020-04-23 DIAGNOSIS — R1310 Dysphagia, unspecified: Secondary | ICD-10-CM | POA: Diagnosis not present

## 2020-05-10 DIAGNOSIS — Q649 Congenital malformation of urinary system, unspecified: Secondary | ICD-10-CM | POA: Diagnosis not present

## 2020-05-17 DIAGNOSIS — R319 Hematuria, unspecified: Secondary | ICD-10-CM | POA: Diagnosis not present

## 2020-05-17 DIAGNOSIS — R809 Proteinuria, unspecified: Secondary | ICD-10-CM | POA: Diagnosis not present

## 2020-05-17 DIAGNOSIS — R5383 Other fatigue: Secondary | ICD-10-CM | POA: Diagnosis not present

## 2020-05-17 DIAGNOSIS — N390 Urinary tract infection, site not specified: Secondary | ICD-10-CM | POA: Diagnosis not present

## 2020-05-23 DIAGNOSIS — R310 Gross hematuria: Secondary | ICD-10-CM | POA: Diagnosis not present

## 2020-05-23 DIAGNOSIS — N23 Unspecified renal colic: Secondary | ICD-10-CM | POA: Diagnosis not present

## 2020-05-23 DIAGNOSIS — Z87442 Personal history of urinary calculi: Secondary | ICD-10-CM | POA: Diagnosis not present

## 2020-05-23 DIAGNOSIS — N201 Calculus of ureter: Secondary | ICD-10-CM | POA: Diagnosis not present

## 2020-05-23 DIAGNOSIS — R109 Unspecified abdominal pain: Secondary | ICD-10-CM | POA: Diagnosis not present

## 2020-05-23 DIAGNOSIS — Z87440 Personal history of urinary (tract) infections: Secondary | ICD-10-CM | POA: Diagnosis not present

## 2020-05-23 DIAGNOSIS — K449 Diaphragmatic hernia without obstruction or gangrene: Secondary | ICD-10-CM | POA: Diagnosis not present

## 2020-05-23 DIAGNOSIS — R112 Nausea with vomiting, unspecified: Secondary | ICD-10-CM | POA: Diagnosis not present

## 2020-05-23 DIAGNOSIS — R319 Hematuria, unspecified: Secondary | ICD-10-CM | POA: Diagnosis not present

## 2020-05-23 DIAGNOSIS — R1012 Left upper quadrant pain: Secondary | ICD-10-CM | POA: Diagnosis not present

## 2020-05-23 DIAGNOSIS — R5382 Chronic fatigue, unspecified: Secondary | ICD-10-CM | POA: Diagnosis not present

## 2020-05-23 DIAGNOSIS — Z800 Family history of malignant neoplasm of digestive organs: Secondary | ICD-10-CM | POA: Diagnosis not present

## 2020-05-23 DIAGNOSIS — N132 Hydronephrosis with renal and ureteral calculous obstruction: Secondary | ICD-10-CM | POA: Diagnosis not present

## 2020-05-23 DIAGNOSIS — Z888 Allergy status to other drugs, medicaments and biological substances status: Secondary | ICD-10-CM | POA: Diagnosis not present

## 2020-06-06 ENCOUNTER — Other Ambulatory Visit: Payer: Self-pay | Admitting: Family Medicine

## 2020-06-06 ENCOUNTER — Other Ambulatory Visit (HOSPITAL_COMMUNITY)
Admission: RE | Admit: 2020-06-06 | Discharge: 2020-06-06 | Disposition: A | Discharge status: Home / Self Care | Payer: BC Managed Care – PPO | Source: Ambulatory Visit | Attending: Family Medicine | Admitting: Family Medicine

## 2020-06-06 DIAGNOSIS — E785 Hyperlipidemia, unspecified: Secondary | ICD-10-CM | POA: Diagnosis not present

## 2020-06-06 DIAGNOSIS — Z5181 Encounter for therapeutic drug level monitoring: Secondary | ICD-10-CM | POA: Diagnosis not present

## 2020-06-06 DIAGNOSIS — R399 Unspecified symptoms and signs involving the genitourinary system: Secondary | ICD-10-CM | POA: Diagnosis not present

## 2020-06-06 DIAGNOSIS — Z01411 Encounter for gynecological examination (general) (routine) with abnormal findings: Secondary | ICD-10-CM | POA: Insufficient documentation

## 2020-06-06 DIAGNOSIS — Z0000 Encounter for general adult medical examination without abnormal findings: Secondary | ICD-10-CM | POA: Diagnosis not present

## 2020-06-06 DIAGNOSIS — E039 Hypothyroidism, unspecified: Secondary | ICD-10-CM | POA: Diagnosis not present

## 2020-06-06 DIAGNOSIS — Z124 Encounter for screening for malignant neoplasm of cervix: Secondary | ICD-10-CM | POA: Diagnosis not present

## 2020-06-11 ENCOUNTER — Other Ambulatory Visit: Payer: Self-pay

## 2020-06-11 ENCOUNTER — Ambulatory Visit
Admission: RE | Admit: 2020-06-11 | Discharge: 2020-06-11 | Disposition: A | Discharge status: Home / Self Care | Payer: BC Managed Care – PPO | Source: Ambulatory Visit | Attending: Family Medicine | Admitting: Family Medicine

## 2020-06-11 DIAGNOSIS — Z1231 Encounter for screening mammogram for malignant neoplasm of breast: Secondary | ICD-10-CM

## 2020-06-11 LAB — CYTOLOGY - PAP
Comment: NEGATIVE
Diagnosis: NEGATIVE
High risk HPV: NEGATIVE

## 2020-07-01 DIAGNOSIS — K219 Gastro-esophageal reflux disease without esophagitis: Secondary | ICD-10-CM | POA: Diagnosis not present

## 2020-07-01 DIAGNOSIS — K449 Diaphragmatic hernia without obstruction or gangrene: Secondary | ICD-10-CM | POA: Diagnosis not present

## 2020-07-01 DIAGNOSIS — K922 Gastrointestinal hemorrhage, unspecified: Secondary | ICD-10-CM | POA: Diagnosis not present

## 2020-07-01 DIAGNOSIS — K2289 Other specified disease of esophagus: Secondary | ICD-10-CM | POA: Diagnosis not present

## 2020-07-01 DIAGNOSIS — R1310 Dysphagia, unspecified: Secondary | ICD-10-CM | POA: Diagnosis not present

## 2020-07-01 DIAGNOSIS — Z1211 Encounter for screening for malignant neoplasm of colon: Secondary | ICD-10-CM | POA: Diagnosis not present

## 2020-07-01 DIAGNOSIS — K319 Disease of stomach and duodenum, unspecified: Secondary | ICD-10-CM | POA: Diagnosis not present

## 2020-07-01 HISTORY — PX: OTHER SURGICAL HISTORY: SHX169

## 2020-07-04 DIAGNOSIS — R310 Gross hematuria: Secondary | ICD-10-CM | POA: Diagnosis not present

## 2020-07-04 DIAGNOSIS — N202 Calculus of kidney with calculus of ureter: Secondary | ICD-10-CM | POA: Diagnosis not present

## 2020-07-05 ENCOUNTER — Other Ambulatory Visit: Payer: Self-pay | Admitting: Urology

## 2020-07-16 ENCOUNTER — Encounter (HOSPITAL_BASED_OUTPATIENT_CLINIC_OR_DEPARTMENT_OTHER): Payer: Self-pay | Admitting: Urology

## 2020-07-16 ENCOUNTER — Other Ambulatory Visit: Payer: Self-pay

## 2020-07-16 NOTE — Progress Notes (Addendum)
Spoke w/ via phone for pre-op interview---pt Lab needs dos----  I stat              Lab results-----echo 05-03-2018 epic- COVID test ------07-17-2020 1100 am Arrive at -------1215 07-19-2020 NPO after MN NO Solid Food.  Clear liquids from MN until---1115 am then npo Med rec completed Medications to take morning of surgery -----omeprazole Diabetic medication -----n/a Patient instructed to bring photo id and insurance card day of surgery Patient aware to have Driver (ride ) / caregiver spouse Brianna Austin will drop pt off   for 24 hours after surgery  Patient Special Instructions -----none Pre-Op special Istructions -----none Patient verbalized understanding of instructions that were given at this phone interview. Patient denies shortness of breath, chest pain, fever, cough at this phone interview.  Anesthesia record from colonscopy/endoscopy April 25-2022 on chart

## 2020-07-17 ENCOUNTER — Other Ambulatory Visit (HOSPITAL_COMMUNITY)
Admission: RE | Admit: 2020-07-17 | Discharge: 2020-07-17 | Disposition: A | Discharge status: Home / Self Care | Payer: BC Managed Care – PPO | Source: Ambulatory Visit | Attending: Urology | Admitting: Urology

## 2020-07-17 DIAGNOSIS — Z888 Allergy status to other drugs, medicaments and biological substances status: Secondary | ICD-10-CM | POA: Diagnosis not present

## 2020-07-17 DIAGNOSIS — Z20822 Contact with and (suspected) exposure to covid-19: Secondary | ICD-10-CM | POA: Insufficient documentation

## 2020-07-17 DIAGNOSIS — Z01812 Encounter for preprocedural laboratory examination: Secondary | ICD-10-CM | POA: Insufficient documentation

## 2020-07-17 DIAGNOSIS — N202 Calculus of kidney with calculus of ureter: Secondary | ICD-10-CM | POA: Diagnosis not present

## 2020-07-17 DIAGNOSIS — R109 Unspecified abdominal pain: Secondary | ICD-10-CM | POA: Diagnosis not present

## 2020-07-17 DIAGNOSIS — R319 Hematuria, unspecified: Secondary | ICD-10-CM | POA: Diagnosis not present

## 2020-07-17 DIAGNOSIS — N132 Hydronephrosis with renal and ureteral calculous obstruction: Secondary | ICD-10-CM | POA: Diagnosis not present

## 2020-07-17 LAB — SARS CORONAVIRUS 2 (TAT 6-24 HRS): SARS Coronavirus 2: NEGATIVE

## 2020-07-19 ENCOUNTER — Ambulatory Visit (HOSPITAL_BASED_OUTPATIENT_CLINIC_OR_DEPARTMENT_OTHER): Payer: BC Managed Care – PPO | Admitting: Anesthesiology

## 2020-07-19 ENCOUNTER — Encounter (HOSPITAL_BASED_OUTPATIENT_CLINIC_OR_DEPARTMENT_OTHER): Payer: Self-pay | Admitting: Urology

## 2020-07-19 ENCOUNTER — Ambulatory Visit (HOSPITAL_BASED_OUTPATIENT_CLINIC_OR_DEPARTMENT_OTHER)
Admission: RE | Admit: 2020-07-19 | Discharge: 2020-07-19 | Disposition: A | Discharge status: Home / Self Care | Payer: BC Managed Care – PPO | Attending: Urology | Admitting: Urology

## 2020-07-19 ENCOUNTER — Encounter (HOSPITAL_COMMUNITY): Payer: Self-pay | Admitting: Emergency Medicine

## 2020-07-19 ENCOUNTER — Other Ambulatory Visit: Payer: Self-pay

## 2020-07-19 ENCOUNTER — Emergency Department (HOSPITAL_COMMUNITY)
Admission: EM | Admit: 2020-07-19 | Discharge: 2020-07-20 | Disposition: A | Discharge status: Home / Self Care | Payer: BC Managed Care – PPO | Source: Home / Self Care | Attending: Emergency Medicine | Admitting: Emergency Medicine

## 2020-07-19 ENCOUNTER — Encounter (HOSPITAL_BASED_OUTPATIENT_CLINIC_OR_DEPARTMENT_OTHER)
Admission: RE | Disposition: A | Discharge status: Home / Self Care | Payer: Self-pay | Source: Home / Self Care | Attending: Urology

## 2020-07-19 DIAGNOSIS — K219 Gastro-esophageal reflux disease without esophagitis: Secondary | ICD-10-CM | POA: Insufficient documentation

## 2020-07-19 DIAGNOSIS — N132 Hydronephrosis with renal and ureteral calculous obstruction: Secondary | ICD-10-CM | POA: Diagnosis not present

## 2020-07-19 DIAGNOSIS — R1110 Vomiting, unspecified: Secondary | ICD-10-CM | POA: Insufficient documentation

## 2020-07-19 DIAGNOSIS — Z87442 Personal history of urinary calculi: Secondary | ICD-10-CM | POA: Insufficient documentation

## 2020-07-19 DIAGNOSIS — E039 Hypothyroidism, unspecified: Secondary | ICD-10-CM | POA: Insufficient documentation

## 2020-07-19 DIAGNOSIS — R109 Unspecified abdominal pain: Secondary | ICD-10-CM | POA: Insufficient documentation

## 2020-07-19 DIAGNOSIS — Z20822 Contact with and (suspected) exposure to covid-19: Secondary | ICD-10-CM | POA: Insufficient documentation

## 2020-07-19 DIAGNOSIS — R319 Hematuria, unspecified: Secondary | ICD-10-CM | POA: Insufficient documentation

## 2020-07-19 DIAGNOSIS — Z79899 Other long term (current) drug therapy: Secondary | ICD-10-CM | POA: Insufficient documentation

## 2020-07-19 DIAGNOSIS — J45909 Unspecified asthma, uncomplicated: Secondary | ICD-10-CM | POA: Insufficient documentation

## 2020-07-19 DIAGNOSIS — G8918 Other acute postprocedural pain: Secondary | ICD-10-CM | POA: Insufficient documentation

## 2020-07-19 DIAGNOSIS — M810 Age-related osteoporosis without current pathological fracture: Secondary | ICD-10-CM | POA: Diagnosis not present

## 2020-07-19 DIAGNOSIS — Z888 Allergy status to other drugs, medicaments and biological substances status: Secondary | ICD-10-CM | POA: Insufficient documentation

## 2020-07-19 DIAGNOSIS — K589 Irritable bowel syndrome without diarrhea: Secondary | ICD-10-CM | POA: Diagnosis not present

## 2020-07-19 HISTORY — DX: Calculus of ureter: N20.1

## 2020-07-19 HISTORY — DX: Presence of spectacles and contact lenses: Z97.3

## 2020-07-19 HISTORY — DX: Anemia, unspecified: D64.9

## 2020-07-19 HISTORY — DX: Unspecified asthma, uncomplicated: J45.909

## 2020-07-19 HISTORY — DX: Unspecified osteoarthritis, unspecified site: M19.90

## 2020-07-19 HISTORY — DX: Myoneural disorder, unspecified: G70.9

## 2020-07-19 HISTORY — DX: Hypothyroidism, unspecified: E03.9

## 2020-07-19 HISTORY — DX: Dysphagia, unspecified: R13.10

## 2020-07-19 HISTORY — DX: Age-related osteoporosis without current pathological fracture: M81.0

## 2020-07-19 HISTORY — DX: Patellofemoral disorders, unspecified knee: M22.2X9

## 2020-07-19 HISTORY — DX: Other symptoms and signs involving the musculoskeletal system: R29.898

## 2020-07-19 HISTORY — DX: Headache, unspecified: R51.9

## 2020-07-19 HISTORY — DX: Irritable bowel syndrome, unspecified: K58.9

## 2020-07-19 HISTORY — PX: CYSTOSCOPY/URETEROSCOPY/HOLMIUM LASER/STENT PLACEMENT: SHX6546

## 2020-07-19 HISTORY — DX: Thyrotoxicosis with diffuse goiter without thyrotoxic crisis or storm: E05.00

## 2020-07-19 HISTORY — DX: Personal history of other diseases of the digestive system: Z87.19

## 2020-07-19 HISTORY — DX: Fracture of unspecified metatarsal bone(s), right foot, initial encounter for closed fracture: S92.301A

## 2020-07-19 HISTORY — DX: Other specified disorders of veins: I87.8

## 2020-07-19 LAB — POCT I-STAT, CHEM 8
BUN: 21 mg/dL — ABNORMAL HIGH (ref 6–20)
Calcium, Ion: 1.2 mmol/L (ref 1.15–1.40)
Chloride: 100 mmol/L (ref 98–111)
Creatinine, Ser: 1 mg/dL (ref 0.44–1.00)
Glucose, Bld: 97 mg/dL (ref 70–99)
HCT: 42 % (ref 36.0–46.0)
Hemoglobin: 14.3 g/dL (ref 12.0–15.0)
Potassium: 3.5 mmol/L (ref 3.5–5.1)
Sodium: 137 mmol/L (ref 135–145)
TCO2: 27 mmol/L (ref 22–32)

## 2020-07-19 SURGERY — CYSTOSCOPY/URETEROSCOPY/HOLMIUM LASER/STENT PLACEMENT
Anesthesia: General | Site: Bladder | Laterality: Bilateral

## 2020-07-19 MED ORDER — LIDOCAINE HCL (CARDIAC) PF 100 MG/5ML IV SOSY
PREFILLED_SYRINGE | INTRAVENOUS | Status: DC | PRN
  Administered 2020-07-19: 60 mg via INTRAVENOUS

## 2020-07-19 MED ORDER — PHENYLEPHRINE 40 MCG/ML (10ML) SYRINGE FOR IV PUSH (FOR BLOOD PRESSURE SUPPORT)
PREFILLED_SYRINGE | INTRAVENOUS | Status: AC
  Filled 2020-07-19: qty 40

## 2020-07-19 MED ORDER — PHENYLEPHRINE HCL (PRESSORS) 10 MG/ML IV SOLN
INTRAVENOUS | Status: DC | PRN
  Administered 2020-07-19: 80 ug via INTRAVENOUS

## 2020-07-19 MED ORDER — FENTANYL CITRATE (PF) 100 MCG/2ML IJ SOLN
INTRAMUSCULAR | Status: DC | PRN
  Administered 2020-07-19 (×2): 50 ug via INTRAVENOUS

## 2020-07-19 MED ORDER — SCOPOLAMINE 1 MG/3DAYS TD PT72
MEDICATED_PATCH | TRANSDERMAL | Status: AC
  Filled 2020-07-19: qty 1

## 2020-07-19 MED ORDER — FENTANYL CITRATE (PF) 100 MCG/2ML IJ SOLN
INTRAMUSCULAR | Status: AC
  Filled 2020-07-19: qty 2

## 2020-07-19 MED ORDER — OXYCODONE HCL 5 MG/5ML PO SOLN: 5.0000 mg | Freq: Once | ORAL | Status: DC | PRN

## 2020-07-19 MED ORDER — IOHEXOL 300 MG/ML  SOLN
INTRAMUSCULAR | Status: DC | PRN
  Administered 2020-07-19: 27 mL via URETHRAL

## 2020-07-19 MED ORDER — MIDAZOLAM HCL 2 MG/2ML IJ SOLN
INTRAMUSCULAR | Status: DC | PRN
  Administered 2020-07-19: 1 mg via INTRAVENOUS

## 2020-07-19 MED ORDER — SCOPOLAMINE 1 MG/3DAYS TD PT72
1.0000 | MEDICATED_PATCH | TRANSDERMAL | Status: DC
  Administered 2020-07-19: 1.5 mg via TRANSDERMAL

## 2020-07-19 MED ORDER — OXYCODONE-ACETAMINOPHEN 5-325 MG PO TABS: 1.0000 | ORAL_TABLET | Freq: Four times a day (QID) | ORAL | 0 refills | Status: AC | PRN

## 2020-07-19 MED ORDER — ONDANSETRON HCL 4 MG/2ML IJ SOLN
INTRAMUSCULAR | Status: AC
  Filled 2020-07-19: qty 8

## 2020-07-19 MED ORDER — PROPOFOL 10 MG/ML IV BOLUS
INTRAVENOUS | Status: DC | PRN
  Administered 2020-07-19: 150 mg via INTRAVENOUS

## 2020-07-19 MED ORDER — SENNOSIDES-DOCUSATE SODIUM 8.6-50 MG PO TABS: 1.0000 | ORAL_TABLET | Freq: Two times a day (BID) | ORAL | 0 refills | Status: AC

## 2020-07-19 MED ORDER — SODIUM CHLORIDE 0.9 % IR SOLN
Status: DC | PRN
  Administered 2020-07-19: 3500 mL

## 2020-07-19 MED ORDER — LACTATED RINGERS IV SOLN: INTRAVENOUS | Status: DC

## 2020-07-19 MED ORDER — KETOROLAC TROMETHAMINE 10 MG PO TABS: 10.0000 mg | ORAL_TABLET | Freq: Three times a day (TID) | ORAL | 0 refills | Status: AC | PRN

## 2020-07-19 MED ORDER — OXYCODONE HCL 5 MG PO TABS: 5.0000 mg | ORAL_TABLET | Freq: Once | ORAL | Status: DC | PRN

## 2020-07-19 MED ORDER — KETOROLAC TROMETHAMINE 30 MG/ML IJ SOLN: 30.0000 mg | Freq: Once | INTRAMUSCULAR | Status: DC | PRN

## 2020-07-19 MED ORDER — ACETAMINOPHEN 500 MG PO TABS
ORAL_TABLET | ORAL | Status: AC
  Filled 2020-07-19: qty 2

## 2020-07-19 MED ORDER — ACETAMINOPHEN 500 MG PO TABS
1000.0000 mg | ORAL_TABLET | Freq: Once | ORAL | Status: AC
  Administered 2020-07-19: 1000 mg via ORAL

## 2020-07-19 MED ORDER — MIDAZOLAM HCL 2 MG/2ML IJ SOLN
INTRAMUSCULAR | Status: AC
  Filled 2020-07-19: qty 2

## 2020-07-19 MED ORDER — EPHEDRINE 5 MG/ML INJ
INTRAVENOUS | Status: AC
  Filled 2020-07-19: qty 40

## 2020-07-19 MED ORDER — ONDANSETRON HCL 4 MG/2ML IJ SOLN
INTRAMUSCULAR | Status: DC | PRN
  Administered 2020-07-19: 4 mg via INTRAVENOUS

## 2020-07-19 MED ORDER — HYDROMORPHONE HCL 1 MG/ML IJ SOLN: 0.2500 mg | INTRAMUSCULAR | Status: DC | PRN

## 2020-07-19 MED ORDER — GENTAMICIN SULFATE 40 MG/ML IJ SOLN
5.0000 mg/kg | INTRAVENOUS | Status: AC
  Administered 2020-07-19: 330 mg via INTRAVENOUS
  Filled 2020-07-19: qty 8.25

## 2020-07-19 MED ORDER — PROMETHAZINE HCL 25 MG/ML IJ SOLN: 6.2500 mg | INTRAMUSCULAR | Status: DC | PRN

## 2020-07-19 MED ORDER — CEPHALEXIN 500 MG PO CAPS: 500.0000 mg | ORAL_CAPSULE | Freq: Two times a day (BID) | ORAL | 0 refills | Status: AC

## 2020-07-19 MED ORDER — DEXAMETHASONE SODIUM PHOSPHATE 4 MG/ML IJ SOLN
INTRAMUSCULAR | Status: DC | PRN
  Administered 2020-07-19: 8 mg via INTRAVENOUS

## 2020-07-19 SURGICAL SUPPLY — 28 items
BAG DRAIN URO-CYSTO SKYTR STRL (DRAIN) ×2 IMPLANT
BAG DRN UROCATH (DRAIN) ×1
BASKET LASER NITINOL 1.9FR (BASKET) ×4 IMPLANT
BSKT STON RTRVL 120 1.9FR (BASKET) ×2
CATH INTERMIT  6FR 70CM (CATHETERS) ×2 IMPLANT
CLOTH BEACON ORANGE TIMEOUT ST (SAFETY) ×2 IMPLANT
FIBER LASER FLEXIVA 365 (UROLOGICAL SUPPLIES) IMPLANT
GLOVE SURG ENC MOIS LTX SZ7 (GLOVE) ×4 IMPLANT
GLOVE SURG ENC MOIS LTX SZ7.5 (GLOVE) ×2 IMPLANT
GOWN STRL REUS W/TWL LRG LVL3 (GOWN DISPOSABLE) ×2 IMPLANT
GOWN STRL REUS W/TWL XL LVL3 (GOWN DISPOSABLE) ×2 IMPLANT
GUIDEWIRE ANG ZIPWIRE 038X150 (WIRE) ×4 IMPLANT
GUIDEWIRE STR DUAL SENSOR (WIRE) ×4 IMPLANT
IV NS 1000ML (IV SOLUTION)
IV NS 1000ML BAXH (IV SOLUTION) IMPLANT
IV NS IRRIG 3000ML ARTHROMATIC (IV SOLUTION) ×4 IMPLANT
KIT TURNOVER CYSTO (KITS) ×2 IMPLANT
MANIFOLD NEPTUNE II (INSTRUMENTS) ×2 IMPLANT
NS IRRIG 500ML POUR BTL (IV SOLUTION) IMPLANT
PACK CYSTO (CUSTOM PROCEDURE TRAY) ×2 IMPLANT
SHEATH URETERAL 12FRX28CM (UROLOGICAL SUPPLIES) ×2 IMPLANT
STENT POLARIS 5FRX24 (STENTS) ×4 IMPLANT
SYR 10ML LL (SYRINGE) IMPLANT
TRACTIP FLEXIVA PULS ID 200XHI (Laser) IMPLANT
TRACTIP FLEXIVA PULSE ID 200 (Laser)
TUBE CONNECTING 12X1/4 (SUCTIONS) ×2 IMPLANT
TUBE FEEDING 8FR 16IN STR KANG (MISCELLANEOUS) ×2 IMPLANT
TUBING UROLOGY SET (TUBING) ×2 IMPLANT

## 2020-07-19 NOTE — H&P (Signed)
Brianna Austin is an 50 y.o. female.    Chief Complaint: Pre-Op BILATERAL Ureteroscopic Stone Manipulation  HPI:   1 - Urolithiasis - Left distal ureteral 54mm with mod hydro, Rt renal 39mm non-obstrudting by CT 05/2020 whjile in [place]. Given trial of medical therapy and no interval passage. One prior episdoe 20 years prior. NO fevers.   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   PMH sig for umbil hernia repari (no mesh), hypothyroid after Graves treatment (follows Dr. Romero Belling). NO ischemic CV disease / blood thinners. 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 Her father is a patient of mine as well Ashok Croon). Her PCP is Shirlean Mylar MD   Today "Brianna Austin" is seen to proceed with BILATERAL ureteroscopy with goal of stone free. No interval fevers. C19 screen negative. Most recent UCX negative.    Past Medical History:  Diagnosis Date  . Anemia 16-20 yrs ago  . Arthritis    oa  . Asthma    mild  . Dysphagia   . Fracture of unspecified metatarsal bone(s), right foot, initial encounter for closed fracture    fx x 3 per pt  . GERD (gastroesophageal reflux disease)   . Graves disease   . Headache   . History of chest pain 2020   no cause found was told was reflux and stress none since  . History of hiatal hernia   . History of kidney stones 2001   passed on own  . Hyperthyroidism   . Hypothyroidism   . IBS (irritable bowel syndrome)   . Left ureteral stone   . Neuromuscular disorder (HCC)    top of left finger index  does not bend  . Osteoporosis   . Patella-femoral syndrome    both knees  . TMJ click   . Umbilical hernia   . Venous stasis    both legs right worse than left wears compression stocking for  . Wears glasses    for reading    Past Surgical History:  Procedure Laterality Date  . APPENDECTOMY    . colonscopy and endoscopy  07/01/2020   dr Loreta Ave  .  ENDOMETRIAL ABLATION  02/2008  . HAND SURGERY  1978   left hand and finger "split open"  . HERNIA REPAIR  02/2010   abdominal hernia  . TONSILLECTOMY  02/2007   adoindectomy    Family History  Problem Relation Age of Onset  . Glaucoma Other   . Hyperlipidemia Other   . Breast cancer Other   . Graves' disease Other   . Hypertension Other   . Asthma Other   . GER disease Other    Social History:  reports that she has never smoked. She has never used smokeless tobacco. She reports current alcohol use. She reports previous drug use.  Allergies:  Allergies  Allergen Reactions  . Fosamax [Alendronate Sodium] Anaphylaxis  . Ambien [Zolpidem Tartrate] Other (See Comments)    Hallucinations/vomiting    No medications prior to admission.    Results for orders placed or performed during the hospital encounter of 07/17/20 (from the past 48 hour(s))  SARS CORONAVIRUS 2 (TAT 6-24 HRS) Nasopharyngeal Nasopharyngeal Swab     Status: None   Collection Time: 07/17/20  1:47 PM   Specimen: Nasopharyngeal Swab  Result Value Ref Range   SARS Coronavirus 2 NEGATIVE NEGATIVE    Comment: (  NOTE) SARS-CoV-2 target nucleic acids are NOT DETECTED.  The SARS-CoV-2 RNA is generally detectable in upper and lower respiratory specimens during the acute phase of infection. Negative results do not preclude SARS-CoV-2 infection, do not rule out co-infections with other pathogens, and should not be used as the sole basis for treatment or other patient management decisions. Negative results must be combined with clinical observations, patient history, and epidemiological information. The expected result is Negative.  Fact Sheet for Patients: HairSlick.no  Fact Sheet for Healthcare Providers: quierodirigir.com  This test is not yet approved or cleared by the [place] FDA and  has been authorized for detection and/or diagnosis of SARS-CoV-2  by FDA under an Emergency Use Authorization (EUA). This EUA will remain  in effect (meaning this test can be used) for the duration of the COVID-19 declaration under Se ction 564(b)(1) of the Act, 21 U.S.C. section 360bbb-3(b)(1), unless the authorization is terminated or revoked sooner.  Performed at [facility], [address], [place], [place] [address]    No results found.  Review of Systems  Constitutional: Negative for chills and fever.  Genitourinary: Positive for flank pain.  All other systems reviewed and are negative.   Height 5\' 5"  (1.651 m), weight 81.6 kg. Physical Exam Vitals reviewed.  HENT:     Head: Normocephalic.     Nose: Nose normal.     Mouth/Throat:     Mouth: Mucous membranes are moist.  Eyes:     Pupils: Pupils are equal, round, and reactive to light.  Cardiovascular:     Rate and Rhythm: Normal rate.  Pulmonary:     Effort: Pulmonary effort is normal.  Abdominal:     General: Abdomen is flat.  Genitourinary:    Comments: Minimal Left CVAT at present.  Musculoskeletal:        General: Normal range of motion.     Cervical back: Normal range of motion.  Skin:    General: Skin is warm.  Neurological:     General: No focal deficit present.     Mental Status: She is alert.  Psychiatric:        Mood and Affect: Mood normal.      Assessment/Plan  Proceed as planned with BILATERAL ureteroscopic stone manipulation. Risks, benefits, alternatives, expected peri-op course, discussed previously and reiterated today.   , MD 07/19/2020, 7:22 AM

## 2020-07-19 NOTE — Anesthesia Postprocedure Evaluation (Signed)
Anesthesia Post Note  Patient: Brianna Austin  Procedure(s) Performed: CYSTOSCOPY/RETROGRADE/URETEROSCOPY/HOLMIUM LASER/STENT PLACEMENT (Bilateral Bladder)     Patient location during evaluation: PACU Anesthesia Type: General Level of consciousness: awake and alert, oriented and patient cooperative Pain management: pain level controlled Vital Signs Assessment: post-procedure vital signs reviewed and stable Respiratory status: spontaneous breathing, nonlabored ventilation and respiratory function stable Cardiovascular status: blood pressure returned to baseline and stable Postop Assessment: no apparent nausea or vomiting Anesthetic complications: no   No complications documented.  Last Vitals:  Vitals:   07/19/20 1545 07/19/20 1615  BP: 114/88 105/77  Pulse: 80 79  Resp: 13 16  Temp: 36.5 C 36.6 C  SpO2: 94% 99%    Last Pain:  Vitals:   07/19/20 1615  PainSc: 0-No pain                 Lannie Fields

## 2020-07-19 NOTE — Discharge Instructions (Signed)
1 - You may have urinary urgency (bladder spasms) and bloody urine on / off with stent in place. This is normal.  2 - Remove tethered stents on Monday morning at home by pulling on strings, then blue-white plastic tubing, and discarding. Office is open Monday if any problems arise.   3 - Call MD or go to ER for fever >102, severe pain / nausea / vomiting not relieved by medications, or acute change in medical status   [facility] [phone] Post Ureteroscopy With or Without Stent Instructions  Definitions:  Ureter: The duct that transports urine from the kidney to the bladder. Stent:   A plastic hollow tube that is placed into the ureter, from the kidney to the bladder to prevent the ureter from swelling shut.  GENERAL INSTRUCTIONS:  Despite the fact that no skin incisions were used, the area around the ureter and bladder is raw and irritated. The stent is a foreign body which will further irritate the bladder wall. This irritation is manifested by increased frequency of urination, both day and night, and by an increase in the urge to urinate. In some, the urge to urinate is present almost always. Sometimes the urge is strong enough that you may not be able to stop yourself from urinating. The only real cure is to remove the stent and then give time for the bladder wall to heal which can't be done until the danger of the ureter swelling shut has passed, which varies.  You may see some blood in your urine while the stent is in place and a few days afterwards. Do not be alarmed, even if the urine was clear for a while. Get off your feet and drink lots of fluids until clearing occurs. If you start to pass clots or don't improve, call us.  DIET: You may return to your normal diet immediately. Because of the raw surface of your bladder, alcohol, spicy foods, acid type foods and drinks with caffeine may cause irritation or frequency and should be used in moderation. To keep your  urine flowing freely and to avoid constipation, drink plenty of fluids during the day ( 8-10 glasses ). Tip: Avoid cranberry juice because it is very acidic.  ACTIVITY: Your physical activity doesn't need to be restricted. However, if you are very active, you may see some blood in your urine. We suggest that you reduce your activity under these circumstances until the bleeding has stopped.  BOWELS: It is important to keep your bowels regular during the postoperative period. Straining with bowel movements can cause bleeding. A bowel movement every other day is reasonable. Use a mild laxative if needed, such as Milk of Magnesia 2-3 tablespoons, or 2 Dulcolax tablets. Call if you continue to have problems. If you have been taking narcotics for pain, before, during or after your surgery, you may be constipated. Take a laxative if necessary.   MEDICATION: You should resume your pre-surgery medications unless told not to. In addition you will often be given an antibiotic to prevent infection. These should be taken as prescribed until the bottles are finished unless you are having an unusual reaction to one of the drugs.  PROBLEMS YOU SHOULD REPORT TO [place]:  Fevers over 100.5 Fahrenheit.  Heavy bleeding, or clots ( See above notes about blood in urine ).  Inability to urinate.  Drug reactions ( hives, rash, nausea, vomiting, diarrhea ).  Severe burning or pain with urination that is not improving.  FOLLOW-UP: You will need  a follow-up appointment to monitor your progress. Call for this appointment at the number listed above. Usually the first appointment will be about three to fourteen days after your surgery.      Post Anesthesia Home Care Instructions  Activity: Get plenty of rest for the remainder of the day. A responsible individual must stay with you for 24 hours following the procedure.  For the next 24 hours, DO NOT: -Drive a car -Advertising copywriter -Drink alcoholic  beverages -Take any medication unless instructed by your physician -Make any legal decisions or sign important papers.  Meals: Start with liquid foods such as gelatin or soup. Progress to regular foods as tolerated. Avoid greasy, spicy, heavy foods. If nausea and/or vomiting occur, drink only clear liquids until the nausea and/or vomiting subsides. Call your physician if vomiting continues.  Special Instructions/Symptoms: Your throat may feel dry or sore from the anesthesia or the breathing tube placed in your throat during surgery. If this causes discomfort, gargle with warm salt water. The discomfort should disappear within 24 hours.  If you had a scopolamine patch placed behind your ear for the management of post- operative nausea and/or vomiting:  1. The medication in the patch is effective for 72 hours, after which it should be removed.  Wrap patch in a tissue and discard in the trash. Wash hands thoroughly with soap and water. 2. You may remove the patch earlier than 72 hours if you experience unpleasant side effects which may include dry mouth, dizziness or visual disturbances. 3. Avoid touching the patch. Wash your hands with soap and water after contact with the patch.

## 2020-07-19 NOTE — Brief Op Note (Signed)
07/19/2020  2:51 PM  PATIENT:  Brianna Austin  50 y.o. female  PRE-OPERATIVE DIAGNOSIS:  LEFT URETERAL AND RIGHT RENAL STONE  POST-OPERATIVE DIAGNOSIS:  LEFT URETERAL AND RIGHT RENAL STONE  PROCEDURE:  Procedure(s) with comments: CYSTOSCOPY/RETROGRADE/URETEROSCOPY/HOLMIUM LASER/STENT PLACEMENT (Bilateral) - 1 HR  SURGEON:  Surgeon(s) and Role:    Sebastian Ache, MD - Primary  PHYSICIAN ASSISTANT:   ASSISTANTS: none   ANESTHESIA:   general  EBL:  5 mL   BLOOD ADMINISTERED:none  DRAINS: none   LOCAL MEDICATIONS USED:  NONE  SPECIMEN:  Source of Specimen:  bilateral urolithiasis  DISPOSITION OF SPECIMEN:  part given to pt, part for compositional analyssi  COUNTS:  YES  TOURNIQUET:  * No tourniquets in log *  DICTATION: .Other Dictation: Dictation Number  [id number]  PLAN OF CARE: Discharge to home after PACU  PATIENT DISPOSITION:  PACU - hemodynamically stable.   Delay start of Pharmacological VTE agent (>24hrs) due to surgical blood loss or risk of bleeding: not applicable

## 2020-07-19 NOTE — Anesthesia Preprocedure Evaluation (Addendum)
Anesthesia Evaluation  Patient identified by MRN, date of birth, ID band Patient awake    Reviewed: Allergy & Precautions, NPO status , Patient's Chart, lab work & pertinent test results  Airway Mallampati: I  TM Distance: >3 FB Neck ROM: Full    Dental  (+) Teeth Intact, Dental Advisory Given   Pulmonary asthma (well controlled) ,    Pulmonary exam normal breath sounds clear to auscultation       Cardiovascular hypertension, Pt. on medications Normal cardiovascular exam Rhythm:Regular Rate:Normal  Echo 2020: 1. The left ventricle has normal systolic function with an ejection  fraction of 60-65%. The cavity size was normal. Left ventricular diastolic  Doppler parameters are consistent with impaired relaxation.  2. The right ventricle has normal systolic function. The cavity was  normal. There is no increase in right ventricular wall thickness.  3. The mitral valve is normal in structure.  4. The tricuspid valve is normal in structure.  5. The aortic valve is tricuspid.  6. The pulmonic valve was normal in structure. Pulmonic valve  regurgitation is mild by color flow Doppler.    Chest pain- Cardiac w/u negative 2020, deemed to be 2/2 GERD   Neuro/Psych  Headaches, negative psych ROS   GI/Hepatic Neg liver ROS, hiatal hernia, GERD  Controlled and Medicated,  Endo/Other  Hypothyroidism   Renal/GU L ureteral and right renal stone   negative genitourinary   Musculoskeletal  (+) Arthritis , Osteoarthritis,    Abdominal   Peds  Hematology negative hematology ROS (+)   Anesthesia Other Findings   Reproductive/Obstetrics negative OB ROS                           Anesthesia Physical Anesthesia Plan  ASA: II  Anesthesia Plan: General   Post-op Pain Management:    Induction: Intravenous  PONV Risk Score and Plan: Ondansetron, Dexamethasone, Midazolam, Scopolamine patch - Pre-op and  Treatment may vary due to age or medical condition  Airway Management Planned: LMA  Additional Equipment: None  Intra-op Plan:   Post-operative Plan: Extubation in OR  Informed Consent: I have reviewed the patients History and Physical, chart, labs and discussed the procedure including the risks, benefits and alternatives for the proposed anesthesia with the patient or authorized representative who has indicated his/her understanding and acceptance.     Dental advisory given  Plan Discussed with: CRNA  Anesthesia Plan Comments:        Anesthesia Quick Evaluation

## 2020-07-19 NOTE — ED Triage Notes (Signed)
Patient had kidney stones removed today. She is in pain and vomiting.

## 2020-07-19 NOTE — Anesthesia Procedure Notes (Signed)
Procedure Name: LMA Insertion Date/Time: 07/19/2020 1:56 PM Performed by: Earmon Phoenix, CRNA Pre-anesthesia Checklist: Patient identified, Emergency Drugs available, Suction available, Patient being monitored and Timeout performed Patient Re-evaluated:Patient Re-evaluated prior to induction Oxygen Delivery Method: Circle system utilized Preoxygenation: Pre-oxygenation with 100% oxygen Induction Type: IV induction LMA: LMA inserted LMA Size: 4.0 Number of attempts: 1 Placement Confirmation: positive ETCO2,  CO2 detector and breath sounds checked- equal and bilateral Tube secured with: Tape Dental Injury: Teeth and Oropharynx as per pre-operative assessment

## 2020-07-19 NOTE — Transfer of Care (Signed)
Immediate Anesthesia Transfer of Care Note  Patient: Brianna Austin  Procedure(s) Performed: CYSTOSCOPY/RETROGRADE/URETEROSCOPY/HOLMIUM LASER/STENT PLACEMENT (Bilateral Bladder)  Patient Location: PACU  Anesthesia Type:General  Level of Consciousness: awake, alert , oriented and patient cooperative  Airway & Oxygen Therapy: Patient Spontanous Breathing  Post-op Assessment: Report given to RN and Post -op Vital signs reviewed and stable  Post vital signs: Reviewed and stable  Last Vitals:  Vitals Value Taken Time  BP 106/90 07/19/20 1501  Temp    Pulse 88 07/19/20 1502  Resp    SpO2 93 % 07/19/20 1502  Vitals shown include unvalidated device data.  Last Pain:  Vitals:   07/19/20 1314  PainSc: 0-No pain      Patients Stated Pain Goal: 6 (07/19/20 1314)  Complications: No complications documented.

## 2020-07-20 LAB — CBC WITH DIFFERENTIAL/PLATELET
Abs Immature Granulocytes: 0.05 10*3/uL (ref 0.00–0.07)
Basophils Absolute: 0 10*3/uL (ref 0.0–0.1)
Basophils Relative: 0 %
Eosinophils Absolute: 0 10*3/uL (ref 0.0–0.5)
Eosinophils Relative: 0 %
HCT: 42 % (ref 36.0–46.0)
Hemoglobin: 13.4 g/dL (ref 12.0–15.0)
Immature Granulocytes: 1 %
Lymphocytes Relative: 7 %
Lymphs Abs: 0.7 10*3/uL (ref 0.7–4.0)
MCH: 28.9 pg (ref 26.0–34.0)
MCHC: 31.9 g/dL (ref 30.0–36.0)
MCV: 90.5 fL (ref 80.0–100.0)
Monocytes Absolute: 0.5 10*3/uL (ref 0.1–1.0)
Monocytes Relative: 4 %
Neutro Abs: 9.7 10*3/uL — ABNORMAL HIGH (ref 1.7–7.7)
Neutrophils Relative %: 88 %
Platelets: 197 10*3/uL (ref 150–400)
RBC: 4.64 MIL/uL (ref 3.87–5.11)
RDW: 13.4 % (ref 11.5–15.5)
WBC: 11 10*3/uL — ABNORMAL HIGH (ref 4.0–10.5)
nRBC: 0 % (ref 0.0–0.2)

## 2020-07-20 LAB — BASIC METABOLIC PANEL
Anion gap: 11 (ref 5–15)
BUN: 29 mg/dL — ABNORMAL HIGH (ref 6–20)
CO2: 24 mmol/L (ref 22–32)
Calcium: 8.7 mg/dL — ABNORMAL LOW (ref 8.9–10.3)
Chloride: 101 mmol/L (ref 98–111)
Creatinine, Ser: 1.49 mg/dL — ABNORMAL HIGH (ref 0.44–1.00)
GFR, Estimated: 42 mL/min — ABNORMAL LOW (ref >60)
Glucose, Bld: 120 mg/dL — ABNORMAL HIGH (ref 70–99)
Potassium: 4.3 mmol/L (ref 3.5–5.1)
Sodium: 136 mmol/L (ref 135–145)

## 2020-07-20 LAB — URINALYSIS, MICROSCOPIC (REFLEX): RBC / HPF: >50 RBC/hpf (ref 0–5)

## 2020-07-20 LAB — URINALYSIS, ROUTINE W REFLEX MICROSCOPIC

## 2020-07-20 MED ORDER — FENTANYL CITRATE (PF) 100 MCG/2ML IJ SOLN
50.0000 ug | Freq: Once | INTRAMUSCULAR | Status: AC
  Administered 2020-07-20: 50 ug via INTRAVENOUS
  Filled 2020-07-20: qty 2

## 2020-07-20 MED ORDER — SODIUM CHLORIDE 0.9 % IV BOLUS
1000.0000 mL | Freq: Once | INTRAVENOUS | Status: AC
  Administered 2020-07-20: 1000 mL via INTRAVENOUS

## 2020-07-20 NOTE — ED Provider Notes (Signed)
[facility] Provider Note   CSN: [id number] Arrival date & time: 07/19/20  2155     History Chief Complaint  Patient presents with  . Post-op Problem    Brianna Austin is a 50 y.o. female.  Patient presents to the emergency department with a chief complaint of flank pain.  She reports having had cystoscopy and stone removal yesterday. Reports that she began having significant pain this evening.  Reports having associated hematuria.  She states that she came to the emergency department for further work-up and evaluation after calling her urologist.  Patient currently states that she is feeling much better.  She states that she passed a lot of blood and urine, and has felt better since.  She denies any fevers.  She has had vomiting.  She states that she feels dehydrated.  The history is provided by the patient. No language interpreter was used.       Past Medical History:  Diagnosis Date  . Anemia 16-20 yrs ago  . Arthritis    oa  . Asthma    mild  . Dysphagia   . Fracture of unspecified metatarsal bone(s), right foot, initial encounter for closed fracture    fx x 3 per pt  . GERD (gastroesophageal reflux disease)   . Graves disease   . Headache   . History of chest pain 2020   no cause found was told was reflux and stress none since  . History of hiatal hernia   . History of kidney stones 2001   passed on own  . Hyperthyroidism   . Hypothyroidism   . IBS (irritable bowel syndrome)   . Left ureteral stone   . Neuromuscular disorder (HCC)    top of left finger index  does not bend  . Osteoporosis   . Patella-femoral syndrome    both knees  . TMJ click   . Umbilical hernia   . Venous stasis    both legs right worse than left wears compression stocking for  . Wears glasses    for reading    There are no problems to display for this patient.   Past Surgical History:  Procedure Laterality Date  . APPENDECTOMY    .  colonscopy and endoscopy  07/01/2020   dr Loreta Ave  . ENDOMETRIAL ABLATION  02/2008  . HAND SURGERY  1978   left hand and finger "split open"  . HERNIA REPAIR  02/2010   abdominal hernia  . TONSILLECTOMY  02/2007   adoindectomy     OB History   No obstetric history on file.     Family History  Problem Relation Age of Onset  . Glaucoma Other   . Hyperlipidemia Other   . Breast cancer Other   . Graves' disease Other   . Hypertension Other   . Asthma Other   . GER disease Other     Social History   Tobacco Use  . Smoking status: Never Smoker  . Smokeless tobacco: Never Used  Vaping Use  . Vaping Use: Never used  Substance Use Topics  . Alcohol use: Yes    Comment: 1 drink per week  . Drug use: Not Currently    Home Medications Prior to Admission medications   Medication Sig Start Date End Date Taking? Authorizing Provider  Boswellia-Glucosamine-Vit D (OSTEO BI-FLEX ONE PER DAY PO) Take by mouth.    [provider]  cephALEXin (KEFLEX) 500 MG capsule Take 1 capsule (500 mg total)  by mouth 2 (two) times daily for 3 days. To prevent infection with tethered stent in place. 07/19/20 07/22/20  Sebastian Ache, MD  Cholecalciferol (DIALYVITE VITAMIN D 5000 PO) Take by mouth daily.    [provider]  cyclobenzaprine (FLEXERIL) 10 MG tablet Take 10 mg by mouth 3 (three) times daily as needed for muscle spasms.    [provider]  guaiFENesin (MUCINEX) 600 MG 12 hr tablet Take by mouth as needed.    [provider]  hydrochlorothiazide (MICROZIDE) 12.5 MG capsule Take 12.5 mg by mouth daily. 1200 pm    [provider]  ketorolac (TORADOL) 10 MG tablet Take 1 tablet (10 mg total) by mouth every 8 (eight) hours as needed for moderate pain. Or stent discomfort post-operatively. 07/19/20   Sebastian Ache, MD  levothyroxine (SYNTHROID, LEVOTHROID) 100 MCG tablet 100 mcg at bedtime. 01/25/18   [provider]  Multiple Vitamin  (MULTIVITAMIN) tablet Take 1 tablet by mouth daily. One a day women's over 50 mvi    [provider]  omeprazole (PRILOSEC) 40 MG capsule Take 40 mg by mouth daily.    [provider]  OVER THE COUNTER MEDICATION Ultra flora probiotic daily    [provider]  oxyCODONE-acetaminophen (PERCOCET) 5-325 MG tablet Take 1 tablet by mouth every 6 (six) hours as needed for severe pain. Post-operatively. 07/19/20 07/19/21  Sebastian Ache, MD  senna-docusate (SENOKOT-S) 8.6-50 MG tablet Take 1 tablet by mouth 2 (two) times daily. While taking strongest pain meds to prevent constipation. 07/19/20   Sebastian Ache, MD  venlafaxine XR (EFFEXOR-XR) 75 MG 24 hr capsule at bedtime. For hot flashes 12/01/17   [provider]    Allergies    Fosamax [alendronate sodium], Ambien [zolpidem tartrate], Ibandronic acid, Other, and Zolpidem  Review of Systems   Review of Systems  All other systems reviewed and are negative.   Physical Exam Updated Vital Signs BP 120/85 (BP Location: Left Arm)   Pulse 83   Temp 99 F (37.2 C) (Oral)   Resp 19   Ht 5\' 5"  (1.651 m)   Wt 81.6 kg   SpO2 93%   BMI 29.95 kg/m   Physical Exam Vitals and nursing note reviewed.  Constitutional:      General: She is not in acute distress.    Appearance: She is well-developed.  HENT:     Head: Normocephalic and atraumatic.  Eyes:     Conjunctiva/sclera: Conjunctivae normal.  Cardiovascular:     Rate and Rhythm: Normal rate and regular rhythm.     Heart sounds: No murmur heard.   Pulmonary:     Effort: Pulmonary effort is normal. No respiratory distress.     Breath sounds: Normal breath sounds.  Abdominal:     Palpations: Abdomen is soft.     Tenderness: There is no abdominal tenderness.  Musculoskeletal:        General: Normal range of motion.     Cervical back: Neck supple.  Skin:    General: Skin is warm and dry.  Neurological:     Mental Status: She is alert and oriented to  person, place, and time.  Psychiatric:        Mood and Affect: Mood normal.        Behavior: Behavior normal.     ED Results / Procedures / Treatments   Labs (all labs ordered are listed, but only abnormal results are displayed) Labs Reviewed  CBC WITH DIFFERENTIAL/PLATELET - Abnormal; Notable for  the following components:      Result Value   WBC 11.0 (*)    Neutro Abs 9.7 (*)    All other components within normal limits  URINALYSIS, ROUTINE W REFLEX MICROSCOPIC - Abnormal; Notable for the following components:   Color, Urine RED (*)    APPearance TURBID (*)    Glucose, UA   (*)    Value: TEST NOT REPORTED DUE TO COLOR INTERFERENCE OF URINE PIGMENT   Hgb urine dipstick   (*)    Value: TEST NOT REPORTED DUE TO COLOR INTERFERENCE OF URINE PIGMENT   Bilirubin Urine   (*)    Value: TEST NOT REPORTED DUE TO COLOR INTERFERENCE OF URINE PIGMENT   Ketones, ur   (*)    Value: TEST NOT REPORTED DUE TO COLOR INTERFERENCE OF URINE PIGMENT   Protein, ur   (*)    Value: TEST NOT REPORTED DUE TO COLOR INTERFERENCE OF URINE PIGMENT   Nitrite   (*)    Value: TEST NOT REPORTED DUE TO COLOR INTERFERENCE OF URINE PIGMENT   Leukocytes,Ua   (*)    Value: TEST NOT REPORTED DUE TO COLOR INTERFERENCE OF URINE PIGMENT   All other components within normal limits  URINALYSIS, MICROSCOPIC (REFLEX) - Abnormal; Notable for the following components:   Bacteria, UA FEW (*)    All other components within normal limits  BASIC METABOLIC PANEL    EKG None  Radiology No results found.  Procedures Procedures   Medications Ordered in ED Medications  fentaNYL (SUBLIMAZE) injection 50 mcg (has no administration in time range)  sodium chloride 0.9 % bolus 1,000 mL (has no administration in time range)  sodium chloride 0.9 % bolus 1,000 mL (has no administration in time range)    ED Course  I have reviewed the triage vital signs and the nursing notes.  Pertinent labs & imaging results that were  available during my care of the patient were reviewed by me and considered in my medical decision making (see chart for details).    MDM Rules/Calculators/A&P                          Patient here with bilateral flank pain.  She states that since waiting in the waiting room, her pain is significantly improved.  She states that her symptoms are significantly improved now.  She is still having a lot of blood in her urine.  I discussed case with Dr. Liliane Shi, who states that the bleeding can be normal after this procedure.  Creatinine is still in process.  If normal, anticipate discharge. Final Clinical Impression(s) / ED Diagnoses Final diagnoses:  None    Rx / DC Orders ED Discharge Orders    None       Roxy Horseman, PA-C 07/20/20 0708    Glynn Octave, MD 07/20/20 [phone]

## 2020-07-20 NOTE — ED Provider Notes (Signed)
Care transferred from previous provider, [facility].  See note for full HPI.  In summation 50 year old recent cystoscopy and stone removal yesterday came in for flank pain with associated hematuria.  Previous provider contacted Dr. Liliane Shi with urology.  States hematuria is normal.  Creatinine was still pending.  If creatinine without significant abnormality May DC home follow-up with urology Physical Exam  BP 118/83   Pulse 68   Temp 98.2 F (36.8 C) (Oral)   Resp 19   Ht 5\' 5"  (1.651 m)   Wt 81.6 kg   SpO2 99%   BMI 29.95 kg/m   Physical Exam Vitals and nursing note reviewed.  Constitutional:      General: She is not in acute distress.    Appearance: She is well-developed. She is not diaphoretic.  HENT:     Head: Atraumatic.  Eyes:     Pupils: Pupils are equal, round, and reactive to light.  Cardiovascular:     Rate and Rhythm: Normal rate and regular rhythm.  Pulmonary:     Effort: Pulmonary effort is normal. No respiratory distress.  Abdominal:     General: There is no distension.     Palpations: Abdomen is soft.  Musculoskeletal:        General: Normal range of motion.     Cervical back: Normal range of motion and neck supple.  Skin:    General: Skin is warm and dry.  Neurological:     Mental Status: She is alert.     ED Course/Procedures     Procedures Labs Reviewed  CBC WITH DIFFERENTIAL/PLATELET - Abnormal; Notable for the following components:      Result Value   WBC 11.0 (*)    Neutro Abs 9.7 (*)    All other components within normal limits  BASIC METABOLIC PANEL - Abnormal; Notable for the following components:   Glucose, Bld 120 (*)    BUN 29 (*)    Creatinine, Ser 1.49 (*)    Calcium 8.7 (*)    GFR, Estimated 42 (*)    All other components within normal limits  URINALYSIS, ROUTINE W REFLEX MICROSCOPIC - Abnormal; Notable for the following components:   Color, Urine RED (*)    APPearance TURBID (*)    Glucose, UA   (*)    Value: TEST NOT  REPORTED DUE TO COLOR INTERFERENCE OF URINE PIGMENT   Hgb urine dipstick   (*)    Value: TEST NOT REPORTED DUE TO COLOR INTERFERENCE OF URINE PIGMENT   Bilirubin Urine   (*)    Value: TEST NOT REPORTED DUE TO COLOR INTERFERENCE OF URINE PIGMENT   Ketones, ur   (*)    Value: TEST NOT REPORTED DUE TO COLOR INTERFERENCE OF URINE PIGMENT   Protein, ur   (*)    Value: TEST NOT REPORTED DUE TO COLOR INTERFERENCE OF URINE PIGMENT   Nitrite   (*)    Value: TEST NOT REPORTED DUE TO COLOR INTERFERENCE OF URINE PIGMENT   Leukocytes,Ua   (*)    Value: TEST NOT REPORTED DUE TO COLOR INTERFERENCE OF URINE PIGMENT   All other components within normal limits  URINALYSIS, MICROSCOPIC (REFLEX) - Abnormal; Notable for the following components:   Bacteria, UA FEW (*)    All other components within normal limits  No results found. MDM  Patient reassessed.  No current pain.  She did desaturate to 88% after receiving pain medicine here in the ED.  Nasal cannula was removed after a  while and she has had no further hypoxia.  No upper respiratory complaints.  Likely desaturation from sleepiness after pain medication.   Labs reviewed. Mild increase in creatinine.  Discussed increase p.o. fluids, follow-up outpatient with urology.  She is agreeable.  The patient has been appropriately medically screened and/or stabilized in the ED. I have low suspicion for any other emergent medical condition which would require further screening, evaluation or treatment in the ED or require inpatient management.  Patient is hemodynamically stable and in no acute distress.  Patient able to ambulate in department prior to ED.  Evaluation does not show acute pathology that would require ongoing or additional emergent interventions while in the emergency department or further inpatient treatment.  I have discussed the diagnosis with the patient and answered all questions.  Pain is been managed while in the emergency department and  patient has no further complaints prior to discharge.  Patient is comfortable with plan discussed in room and is stable for discharge at this time.  I have discussed strict return precautions for returning to the emergency department.  Patient was encouraged to follow-up with PCP/specialist refer to at discharge.       Teresia Myint A, PA-C 07/20/20 1320    Little, Ambrose Finland, MD 07/21/20 [phone]

## 2020-07-20 NOTE — Op Note (Signed)
NAME: Brianna Austin, [place] MEDICAL RECORD NO: [id number] ACCOUNT NO: [id number] DATE OF BIRTH: 02/04/1971 FACILITY: [facility] LOCATION: [facility] PHYSICIAN: Sebastian Ache, MD  Operative Report   DATE OF PROCEDURE: 07/19/2020  PROCEDURE PERFORMED:   1.  Cystoscopy, bilateral retrograde pyelograms, interpretation. 2.  Bilateral ureteroscopy with laser lithotripsy. 3.  Insertion of bilateral ureteral stents, 5 x 24 Polaris with tether.  ESTIMATED BLOOD LOSS:  Nil.  COMPLICATIONS:  None.  SPECIMEN:  Left ureteral and right renal stone fragments for composite analysis.  FINDINGS:  1.  Significantly impacted left distal ureteral stone with mild hydronephrosis. 2.  Complete resolution of all accessible stone fragments larger than one-third mm following laser lithotripsy and basket extraction on the left side. 3.  Successful placement of left ureteral stent, proximal end in renal pelvis, distal end in urinary bladder. 4.  Right upper pole renal stone estimated probably 5 mm. 5.  Complete resolution of all right-sided renal and ureteral stones following laser lithotripsy and basket extraction. 6.  Successful placement of right ureteral stent, proximal end in renal pelvis, distal end in urinary bladder.  INDICATIONS FOR PROCEDURE:  The patient is a pleasant 50 year old lady who was found on workup of colicky flank and abdominal pain to have a left distal ureteral stone while on vacation in late 05/2020.  She was also noted to have a right small renal  stone, nonobstructing at that time.  She was given a trial of medical therapy, which she has been on for nearly 2 months, but has not had interval stone passage.  Her colic symptoms are mild but persistent.  Given her nonpassage of stone and relatively  young age and certainly some risk of renal demise with continued long-term obstruction, it was felt that the most prudent means of management will be definitive management of her stones.  Options were discussed  including shockwave lithotripsy versus  ureteroscopy unilateral versus bilateral, and she wished to proceed with bilateral ureteroscopy with goal of stone free.  Informed consent was obtained and placed in the medical record.  PROCEDURE IN DETAIL:  The patient being verified, procedure being bilateral ureteroscopic stone manipulation was confirmed.  Procedure timeout was performed.  Intravenous antibiotics was administered. General LMA anesthesia induced.  The patient was  placed into a low lithotomy position.  Sterile field was created.  Prepped and draped the patient's vagina, introitus and proximal thighs and cystourethroscopy was performed using 21-French rigid cystoscope with offset lens.  Inspection of the urinary  bladder revealed no diverticula, calcifications or papillary lesions.  Ureteral orifices were singleton bilaterally.  The right ureteral orifice was cannulated with a 6-French renal catheter and right retrograde pyelogram was obtained.  Right retrograde pyelogram demonstrated a single right ureter, single system right kidney.  No filling defects or narrowing noted.  A 0.038 ZIPwire was advanced to the level of upper pole and set aside as a safety wire.  Next, a left retrograde pyelogram  was obtained.  Left retrograde pyelogram demonstrated a single left ureter, single system left kidney.  There is a filling defect in the distal ureter consistent with known stone with mild hydronephrosis above this.  0.038 ZIPwire was advanced to the level of upper  pole, set aside as a safety wire.  An 8-French feeding tube placed in the urinary bladder for pressure release.  Semi-rigid ureteroscopy was performed the entire length of  the right ureter alongside a separate sensor working wire.  No mucosal  abnormalities were found.  Next, semi-rigid ureteroscopy was  performed of distal left ureter alongside a separate sensor working wire. In the distal left ureter just above the intramural ureter,  focus of stone was encountered.  It was quite impacted and somewhat inflamed.  This is  much too large for simple basketing, as such holmium laser energy applied at setting of 0.2 joules, 20 Hz.  Approximately 30% of the stone was dusted, the remaining 70% fragmented into small pieces, approximately 1 mm of less, that were then amenable to  simple basketing.  The fragments were removed and set aside.  Repeat semi-rigid ureteroscopy was performed the entire length of the left ureter.  No additional calcifications were noted or significant mucosal abnormalities.  As the goal today was to  verify stone free, the semirigid scope was exchanged for a 12/14 short length ureteral access sheath to the level of the proximal ureter, using continuous fluoroscopic guidance, the flexible digital ureteroscopy was performed of the left kidney including  all calices x3.  There were several areas of Randall plaque, but no intraluminal stones within the left kidney.  Following inspection of all calices x3, access sheath was removed under continuous vision.  No significant mucosal abnormalities were found.   Next, the access sheath was placed up toward the right sensor working wire to the level of the proximal right ureter.  Flexible digital ureteroscopy was performed of the proximal right ureter.  Systematic inspection of the right kidney including all  calices x3 revealed a single dominant stone in the upper pole, it was free floating, estimated to be approximately 5 mm.  It was too large for simple basketing.  As such holmium laser energy applied to the stone using settings of 0.2 joules and 20 Hz.   Using a fragmentation technique, the stone was broken into 3 smaller pieces that were then amenable to simple basketing.  They were brought out and set aside.  The right kidney was once again inspected.  There was complete resolution of all  accessible  stone fragments larger than one-third mm.  Excellent hemostasis and no  evidence of renal perforation.  The access sheath was removed under continuous vision.  No significant mucosal abnormalities are found.  Given the bilateral nature of the procedure,  it was felt that brief interval stenting with tether stent would be most prudent.  As such, 5 x 24 Polaris-type stents were placed over the bilateral remaining safety wires using fluoroscopic guidance and good proximal and distal plane were noted.   Tethers were left in place, tucked, externalized, tied to each other and tucked per vagina and the procedure was terminated.  The patient tolerated the procedure well and no immediate perioperative complications.  The  patient was taken to the postanesthesia care unit in stable condition.  Plan to discharge home.   SHW D: 07/19/2020 2:57:43 pm T: 07/20/2020 3:45:00 am  JOB: [id number]/ [id number]

## 2020-07-20 NOTE — Discharge Instructions (Signed)
Follow-up with urology  Return for any worsening symptom

## 2020-07-20 NOTE — ED Notes (Addendum)
Pt desatted to 88% after receiving pain medication. 2 liters [place] applied and sats improved to 93%

## 2020-07-20 NOTE — ED Notes (Signed)
Patient is in the car and does not want to come out until she is being put in a room.

## 2020-07-22 ENCOUNTER — Encounter (HOSPITAL_BASED_OUTPATIENT_CLINIC_OR_DEPARTMENT_OTHER): Payer: Self-pay | Admitting: Urology

## 2020-07-25 DIAGNOSIS — N130 Hydronephrosis with ureteropelvic junction obstruction: Secondary | ICD-10-CM | POA: Diagnosis not present

## 2020-07-25 DIAGNOSIS — N200 Calculus of kidney: Secondary | ICD-10-CM | POA: Diagnosis not present

## 2020-07-25 DIAGNOSIS — N202 Calculus of kidney with calculus of ureter: Secondary | ICD-10-CM | POA: Diagnosis not present

## 2020-07-25 DIAGNOSIS — R3915 Urgency of urination: Secondary | ICD-10-CM | POA: Diagnosis not present

## 2020-07-25 DIAGNOSIS — N390 Urinary tract infection, site not specified: Secondary | ICD-10-CM | POA: Diagnosis not present

## 2020-07-25 DIAGNOSIS — N1330 Unspecified hydronephrosis: Secondary | ICD-10-CM | POA: Diagnosis not present

## 2020-07-25 DIAGNOSIS — R310 Gross hematuria: Secondary | ICD-10-CM | POA: Diagnosis not present

## 2020-07-25 DIAGNOSIS — B9620 Unspecified Escherichia coli [E. coli] as the cause of diseases classified elsewhere: Secondary | ICD-10-CM | POA: Diagnosis not present

## 2020-07-30 DIAGNOSIS — N202 Calculus of kidney with calculus of ureter: Secondary | ICD-10-CM | POA: Diagnosis not present

## 2020-07-30 DIAGNOSIS — N3000 Acute cystitis without hematuria: Secondary | ICD-10-CM | POA: Diagnosis not present

## 2020-07-30 DIAGNOSIS — R3915 Urgency of urination: Secondary | ICD-10-CM | POA: Diagnosis not present

## 2020-10-17 DIAGNOSIS — E785 Hyperlipidemia, unspecified: Secondary | ICD-10-CM | POA: Diagnosis not present

## 2021-01-17 DIAGNOSIS — E039 Hypothyroidism, unspecified: Secondary | ICD-10-CM | POA: Diagnosis not present

## 2021-01-17 DIAGNOSIS — E559 Vitamin D deficiency, unspecified: Secondary | ICD-10-CM | POA: Diagnosis not present

## 2021-01-17 DIAGNOSIS — M810 Age-related osteoporosis without current pathological fracture: Secondary | ICD-10-CM | POA: Diagnosis not present

## 2021-01-24 DIAGNOSIS — E039 Hypothyroidism, unspecified: Secondary | ICD-10-CM | POA: Diagnosis not present

## 2021-01-24 DIAGNOSIS — M810 Age-related osteoporosis without current pathological fracture: Secondary | ICD-10-CM | POA: Diagnosis not present

## 2021-01-24 DIAGNOSIS — N200 Calculus of kidney: Secondary | ICD-10-CM | POA: Diagnosis not present

## 2021-01-24 DIAGNOSIS — E559 Vitamin D deficiency, unspecified: Secondary | ICD-10-CM | POA: Diagnosis not present

## 2021-02-03 DIAGNOSIS — R3915 Urgency of urination: Secondary | ICD-10-CM | POA: Diagnosis not present

## 2021-02-03 DIAGNOSIS — N202 Calculus of kidney with calculus of ureter: Secondary | ICD-10-CM | POA: Diagnosis not present

## 2021-02-03 DIAGNOSIS — N130 Hydronephrosis with ureteropelvic junction obstruction: Secondary | ICD-10-CM | POA: Diagnosis not present

## 2021-02-04 ENCOUNTER — Other Ambulatory Visit: Payer: Self-pay | Admitting: Endocrinology

## 2021-02-04 DIAGNOSIS — M810 Age-related osteoporosis without current pathological fracture: Secondary | ICD-10-CM

## 2021-02-06 DIAGNOSIS — M79642 Pain in left hand: Secondary | ICD-10-CM | POA: Diagnosis not present

## 2021-02-06 DIAGNOSIS — M25562 Pain in left knee: Secondary | ICD-10-CM | POA: Diagnosis not present

## 2021-02-14 ENCOUNTER — Other Ambulatory Visit: Payer: Self-pay | Admitting: Endocrinology

## 2021-02-14 DIAGNOSIS — Z1231 Encounter for screening mammogram for malignant neoplasm of breast: Secondary | ICD-10-CM

## 2021-05-20 DIAGNOSIS — M1712 Unilateral primary osteoarthritis, left knee: Secondary | ICD-10-CM | POA: Diagnosis not present

## 2021-07-14 ENCOUNTER — Ambulatory Visit
Admission: RE | Admit: 2021-07-14 | Discharge: 2021-07-14 | Disposition: A | Discharge status: Home / Self Care | Payer: BC Managed Care – PPO | Source: Ambulatory Visit | Attending: Endocrinology | Admitting: Endocrinology

## 2021-07-14 ENCOUNTER — Ambulatory Visit: Payer: BC Managed Care – PPO

## 2021-07-14 DIAGNOSIS — M810 Age-related osteoporosis without current pathological fracture: Secondary | ICD-10-CM

## 2021-07-14 DIAGNOSIS — Z780 Asymptomatic menopausal state: Secondary | ICD-10-CM | POA: Diagnosis not present

## 2021-07-16 ENCOUNTER — Ambulatory Visit
Admission: RE | Admit: 2021-07-16 | Discharge: 2021-07-16 | Disposition: A | Discharge status: Home / Self Care | Payer: BC Managed Care – PPO | Source: Ambulatory Visit | Attending: Endocrinology | Admitting: Endocrinology

## 2021-07-16 DIAGNOSIS — Z1231 Encounter for screening mammogram for malignant neoplasm of breast: Secondary | ICD-10-CM | POA: Diagnosis not present

## 2022-01-16 DIAGNOSIS — E559 Vitamin D deficiency, unspecified: Secondary | ICD-10-CM | POA: Diagnosis not present

## 2022-01-16 DIAGNOSIS — E039 Hypothyroidism, unspecified: Secondary | ICD-10-CM | POA: Diagnosis not present

## 2022-01-16 DIAGNOSIS — M810 Age-related osteoporosis without current pathological fracture: Secondary | ICD-10-CM | POA: Diagnosis not present

## 2022-01-23 DIAGNOSIS — N200 Calculus of kidney: Secondary | ICD-10-CM | POA: Diagnosis not present

## 2022-01-23 DIAGNOSIS — M810 Age-related osteoporosis without current pathological fracture: Secondary | ICD-10-CM | POA: Diagnosis not present

## 2022-01-23 DIAGNOSIS — E559 Vitamin D deficiency, unspecified: Secondary | ICD-10-CM | POA: Diagnosis not present

## 2022-01-23 DIAGNOSIS — E039 Hypothyroidism, unspecified: Secondary | ICD-10-CM | POA: Diagnosis not present

## 2022-07-21 ENCOUNTER — Other Ambulatory Visit: Payer: Self-pay | Admitting: Endocrinology

## 2022-07-21 ENCOUNTER — Ambulatory Visit
Admission: RE | Admit: 2022-07-21 | Discharge: 2022-07-21 | Disposition: A | Discharge status: Home / Self Care | Payer: BC Managed Care – PPO | Source: Ambulatory Visit | Attending: Endocrinology | Admitting: Endocrinology

## 2022-07-21 DIAGNOSIS — Z1231 Encounter for screening mammogram for malignant neoplasm of breast: Secondary | ICD-10-CM | POA: Diagnosis not present

## 2023-01-18 DIAGNOSIS — Z0000 Encounter for general adult medical examination without abnormal findings: Secondary | ICD-10-CM | POA: Diagnosis not present

## 2023-01-18 DIAGNOSIS — E039 Hypothyroidism, unspecified: Secondary | ICD-10-CM | POA: Diagnosis not present

## 2023-01-18 DIAGNOSIS — E559 Vitamin D deficiency, unspecified: Secondary | ICD-10-CM | POA: Diagnosis not present

## 2023-01-18 DIAGNOSIS — M810 Age-related osteoporosis without current pathological fracture: Secondary | ICD-10-CM | POA: Diagnosis not present

## 2023-01-25 ENCOUNTER — Other Ambulatory Visit (HOSPITAL_BASED_OUTPATIENT_CLINIC_OR_DEPARTMENT_OTHER): Payer: Self-pay | Admitting: Endocrinology

## 2023-01-25 DIAGNOSIS — E039 Hypothyroidism, unspecified: Secondary | ICD-10-CM | POA: Diagnosis not present

## 2023-01-25 DIAGNOSIS — E559 Vitamin D deficiency, unspecified: Secondary | ICD-10-CM | POA: Diagnosis not present

## 2023-01-25 DIAGNOSIS — N200 Calculus of kidney: Secondary | ICD-10-CM | POA: Diagnosis not present

## 2023-01-25 DIAGNOSIS — E7800 Pure hypercholesterolemia, unspecified: Secondary | ICD-10-CM

## 2023-01-25 DIAGNOSIS — M810 Age-related osteoporosis without current pathological fracture: Secondary | ICD-10-CM | POA: Diagnosis not present

## 2023-02-08 ENCOUNTER — Ambulatory Visit (HOSPITAL_COMMUNITY)
Admission: RE | Admit: 2023-02-08 | Discharge: 2023-02-08 | Disposition: A | Discharge status: Home / Self Care | Payer: BC Managed Care – PPO | Source: Ambulatory Visit | Attending: Endocrinology | Admitting: Endocrinology

## 2023-02-08 DIAGNOSIS — E7800 Pure hypercholesterolemia, unspecified: Secondary | ICD-10-CM | POA: Insufficient documentation

## 2023-02-23 DIAGNOSIS — L309 Dermatitis, unspecified: Secondary | ICD-10-CM | POA: Diagnosis not present

## 2023-02-26 DIAGNOSIS — L309 Dermatitis, unspecified: Secondary | ICD-10-CM | POA: Diagnosis not present

## 2023-03-30 DIAGNOSIS — E039 Hypothyroidism, unspecified: Secondary | ICD-10-CM | POA: Diagnosis not present

## 2023-03-30 DIAGNOSIS — Z0000 Encounter for general adult medical examination without abnormal findings: Secondary | ICD-10-CM | POA: Diagnosis not present

## 2023-05-20 DIAGNOSIS — L218 Other seborrheic dermatitis: Secondary | ICD-10-CM | POA: Diagnosis not present

## 2023-07-12 ENCOUNTER — Other Ambulatory Visit: Payer: Self-pay | Admitting: Endocrinology

## 2023-07-12 DIAGNOSIS — M810 Age-related osteoporosis without current pathological fracture: Secondary | ICD-10-CM

## 2023-07-15 DIAGNOSIS — D225 Melanocytic nevi of trunk: Secondary | ICD-10-CM | POA: Diagnosis not present

## 2023-07-15 DIAGNOSIS — L821 Other seborrheic keratosis: Secondary | ICD-10-CM | POA: Diagnosis not present

## 2023-07-15 DIAGNOSIS — L814 Other melanin hyperpigmentation: Secondary | ICD-10-CM | POA: Diagnosis not present

## 2023-07-15 DIAGNOSIS — L309 Dermatitis, unspecified: Secondary | ICD-10-CM | POA: Diagnosis not present

## 2023-07-29 DIAGNOSIS — L309 Dermatitis, unspecified: Secondary | ICD-10-CM | POA: Diagnosis not present

## 2023-09-14 DIAGNOSIS — L309 Dermatitis, unspecified: Secondary | ICD-10-CM | POA: Diagnosis not present

## 2023-09-30 ENCOUNTER — Other Ambulatory Visit: Payer: Self-pay | Admitting: Endocrinology

## 2023-09-30 DIAGNOSIS — Z1231 Encounter for screening mammogram for malignant neoplasm of breast: Secondary | ICD-10-CM

## 2023-10-22 ENCOUNTER — Ambulatory Visit
Admission: RE | Admit: 2023-10-22 | Discharge: 2023-10-22 | Disposition: A | Discharge status: Home / Self Care | Source: Ambulatory Visit | Attending: Endocrinology | Admitting: Endocrinology

## 2023-10-22 DIAGNOSIS — Z1231 Encounter for screening mammogram for malignant neoplasm of breast: Secondary | ICD-10-CM | POA: Diagnosis not present

## 2023-11-24 DIAGNOSIS — M810 Age-related osteoporosis without current pathological fracture: Secondary | ICD-10-CM | POA: Diagnosis not present

## 2024-01-04 DIAGNOSIS — E039 Hypothyroidism, unspecified: Secondary | ICD-10-CM | POA: Diagnosis not present

## 2024-01-04 DIAGNOSIS — M810 Age-related osteoporosis without current pathological fracture: Secondary | ICD-10-CM | POA: Diagnosis not present

## 2024-01-04 DIAGNOSIS — E559 Vitamin D deficiency, unspecified: Secondary | ICD-10-CM | POA: Diagnosis not present

## 2024-01-04 DIAGNOSIS — Z0000 Encounter for general adult medical examination without abnormal findings: Secondary | ICD-10-CM | POA: Diagnosis not present

## 2024-01-11 DIAGNOSIS — E559 Vitamin D deficiency, unspecified: Secondary | ICD-10-CM | POA: Diagnosis not present

## 2024-01-11 DIAGNOSIS — N200 Calculus of kidney: Secondary | ICD-10-CM | POA: Diagnosis not present

## 2024-01-11 DIAGNOSIS — M810 Age-related osteoporosis without current pathological fracture: Secondary | ICD-10-CM | POA: Diagnosis not present

## 2024-01-11 DIAGNOSIS — E039 Hypothyroidism, unspecified: Secondary | ICD-10-CM | POA: Diagnosis not present

## 2024-02-08 NOTE — Progress Notes (Unsigned)
 [facility]  [phone]   HEMATOLOGY ONCOLOGY CONSULTATION   Brianna Austin  DOB: 02-26-1971  MR#: [id number]  CSN#: [id number]    Requesting Physician: Littie Caffey, MD   Patient Care Team: Caffey Littie, MD as PCP - General (Endocrinology)  Reason for consult: polycythemia  History of present illness:   The patient has history of Graves disease, hypothyroid, vitamin D deficiency, osteoporosis, and GERD.she was referred from her endocrinologist. Labs done on 01/04/2024 showed RBC elevated at 5.69, Hgb elevated at 16.3, and Hct elevated at 49.5. The remainder of her blood count with diff was normal. Blood work done on 01/18/2023 showed these values to be within normal limits. A blood count from 07/20/2020 also showed these values to be normal.    MEDICAL HISTORY:  Past Medical History:  Diagnosis Date   Anemia 16-20 yrs ago   Arthritis    oa   Asthma    mild   Dysphagia    Fracture of unspecified metatarsal bone(s), right foot, initial encounter for closed fracture    fx x 3 per pt   GERD (gastroesophageal reflux disease)    Graves disease    Headache    History of chest pain 2020   no cause found was told was reflux and stress none since   History of hiatal hernia    History of kidney stones 02/22/00   passed on own   Hyperthyroidism    Hypothyroidism    IBS (irritable bowel syndrome)    Left ureteral stone    Neuromuscular disorder (HCC)    top of left finger index  does not bend   Osteoporosis    Patella-femoral syndrome    both knees   TMJ click    Umbilical hernia    Venous stasis    both legs right worse than left wears compression stocking for   Wears glasses    for reading    SURGICAL HISTORY: Past Surgical History:  Procedure Laterality Date   APPENDECTOMY     colonscopy and endoscopy  07/01/2020   dr kristie   CYSTOSCOPY/URETEROSCOPY/HOLMIUM LASER/STENT PLACEMENT Bilateral 07/19/2020   Procedure:  CYSTOSCOPY/RETROGRADE/URETEROSCOPY/HOLMIUM LASER/STENT PLACEMENT;  Surgeon: Alvaro Hummer, MD;  Location: [facility];  Service: Urology;  Laterality: Bilateral;  1 HR   ENDOMETRIAL ABLATION  22-Feb-2008   HAND SURGERY  1978   left hand and finger split open   HERNIA REPAIR  02/21/2010   abdominal hernia   TONSILLECTOMY  22-Feb-2007   adoindectomy    SOCIAL HISTORY: Social History   Socioeconomic History   Marital status: Married    Spouse name: Not on file   Number of children: 1   Years of education: Not on file   Highest education level: Not on file  Occupational History   Occupation: IT TRAINER -- self employed  Tobacco Use   Smoking status: Never   Smokeless tobacco: Never  Vaping Use   Vaping status: Never Used  Substance and Sexual Activity   Alcohol use: Yes    Comment: 1 drink per week   Drug use: Not Currently   Sexual activity: Not on file  Other Topics Concern   Not on file  Social History Narrative   Not on file   Social Drivers of Health   Financial Resource Strain: Not on file  Food Insecurity: Not on file  Transportation Needs: Not on file  Physical Activity: Not on file  Stress: Not on file  Social Connections: Unknown (  05/05/2022)   [facility]   Social Network    Social Network: Not on file  Intimate Partner Violence: Unknown (05/05/2022)   Received from [facility]   HITS    Physically Hurt: Not on file    Insult or Talk Down To: Not on file    Threaten Physical Harm: Not on file    Scream or Curse: Not on file    FAMILY HISTORY: Family History  Problem Relation Age of Onset   Breast cancer Mother        66s   Glaucoma Other    Hyperlipidemia Other    Breast cancer Other    Graves' disease Other    Hypertension Other    Asthma Other    GER disease Other     ALLERGIES:  is allergic to fosamax [alendronate sodium], ambien [zolpidem tartrate], ibandronate, other, and zolpidem.  MEDICATIONS:  Current Outpatient  Medications  Medication Sig Dispense Refill   Boswellia-Glucosamine-Vit D (OSTEO BI-FLEX ONE PER DAY PO) Take by mouth.     Cholecalciferol (DIALYVITE VITAMIN D 5000 PO) Take by mouth daily.     cyclobenzaprine (FLEXERIL) 10 MG tablet Take 10 mg by mouth 3 (three) times daily as needed for muscle spasms.     guaiFENesin (MUCINEX) 600 MG 12 hr tablet Take by mouth as needed.     hydrochlorothiazide (MICROZIDE) 12.5 MG capsule Take 12.5 mg by mouth daily. 1200 pm     ketorolac  (TORADOL ) 10 MG tablet Take 1 tablet (10 mg total) by mouth every 8 (eight) hours as needed for moderate pain. Or stent discomfort post-operatively. 20 tablet 0   levothyroxine (SYNTHROID, LEVOTHROID) 100 MCG tablet 100 mcg at bedtime.  3   Multiple Vitamin (MULTIVITAMIN) tablet Take 1 tablet by mouth daily. One a day women's over 50 mvi     omeprazole (PRILOSEC) 40 MG capsule Take 40 mg by mouth daily.     OVER THE COUNTER MEDICATION Ultra flora probiotic daily     senna-docusate (SENOKOT-S) 8.6-50 MG tablet Take 1 tablet by mouth 2 (two) times daily. While taking strongest pain meds to prevent constipation. 10 tablet 0   venlafaxine XR (EFFEXOR-XR) 75 MG 24 hr capsule at bedtime. For hot flashes  2   No current facility-administered medications for this visit.    REVIEW OF SYSTEMS:   Constitutional: Denies fevers, chills or abnormal night sweats Eyes: Denies blurriness of vision, double vision or watery eyes Ears, nose, mouth, throat, and face: Denies mucositis or sore throat Respiratory: Denies cough, dyspnea or wheezes Cardiovascular: Denies palpitation, chest discomfort or lower extremity swelling Gastrointestinal:  Denies nausea, heartburn or change in bowel habits Skin: Denies abnormal skin rashes Lymphatics: Denies new lymphadenopathy or easy bruising Neurological:Denies numbness, tingling or new weaknesses Behavioral/Psych: Mood is stable, no new changes  All other systems were reviewed with the patient and  are negative.  PHYSICAL EXAMINATION: ECOG PERFORMANCE STATUS: {CHL ONC ECOG [id number]}  There were no vitals filed for this visit. There were no vitals filed for this visit.  GENERAL:alert, no distress and comfortable SKIN: skin color, texture, turgor are normal, no rashes or significant lesions EYES: normal, conjunctiva are pink and non-injected, sclera clear OROPHARYNX:no exudate, no erythema and lips, buccal mucosa, and tongue normal  NECK: supple, thyroid  normal size, non-tender, without nodularity LYMPH:  no palpable lymphadenopathy in the cervical, axillary or inguinal LUNGS: clear to auscultation and percussion with normal breathing effort HEART: regular rate & rhythm and no murmurs and  no lower extremity edema ABDOMEN:abdomen soft, non-tender and normal bowel sounds Musculoskeletal:no cyanosis of digits and no clubbing  PSYCH: alert & oriented x 3 with fluent speech NEURO: no focal motor/sensory deficits  LABORATORY DATA:  I have reviewed the data as listed Lab Results  Component Value Date   WBC 11.0 (H) 07/20/2020   HGB 13.4 07/20/2020   HCT 42.0 07/20/2020   MCV 90.5 07/20/2020   PLT 197 07/20/2020   No results for input(s): NA, K, CL, CO2, GLUCOSE, BUN, CREATININE, CALCIUM, GFRNONAA, GFRAA, PROT, ALBUMIN, AST, ALT, ALKPHOS, BILITOT, BILIDIR, IBILI in the last 8760 hours.  RADIOGRAPHIC STUDIES: I have personally reviewed the radiological images as listed and agreed with the findings in the report. No results found.  ASSESSMENT & PLAN:  ***  Recommendations:   All questions were answered. The patient knows to call the clinic with any problems, questions or concerns.      Powell FORBES Lessen, NP 02/08/2024 3:11 PM

## 2024-02-09 ENCOUNTER — Inpatient Hospital Stay

## 2024-02-09 ENCOUNTER — Inpatient Hospital Stay: Attending: Nurse Practitioner | Admitting: Nurse Practitioner

## 2024-02-09 VITALS — BP 118/78 | HR 96 | Temp 96.7°F | Resp 17 | Wt 199.8 lb

## 2024-02-09 DIAGNOSIS — D751 Secondary polycythemia: Secondary | ICD-10-CM | POA: Insufficient documentation

## 2024-02-09 DIAGNOSIS — D7589 Other specified diseases of blood and blood-forming organs: Secondary | ICD-10-CM | POA: Insufficient documentation

## 2024-02-09 DIAGNOSIS — J9811 Atelectasis: Secondary | ICD-10-CM | POA: Diagnosis not present

## 2024-02-09 DIAGNOSIS — Z803 Family history of malignant neoplasm of breast: Secondary | ICD-10-CM | POA: Insufficient documentation

## 2024-02-09 NOTE — Assessment & Plan Note (Signed)
 The patient has history of Graves disease, hypothyroid, vitamin D deficiency, osteoporosis, and GERD. She was referred from her endocrinologist. Labs done on 01/04/2024 showed RBC elevated at 5.69, Hgb elevated at 16.3, and Hct elevated at 49.5. The remainder of her blood count with diff was normal. Blood work done on 01/18/2023 showed these values to be within normal limits. A blood count from 07/20/2020 also showed these values to be normal.  The patient reports history of shortness of breath and wheezing for several years. She had a calcium scoring test done last year due to the symptoms of heaviness in her chest. She was noted to have mild bibasilar atelectasis. CT renal stone study from 2022 also showed bibasilar atelectasis.  Of note, her endocrinologist also referred her to pulmonology.  Initial appointment with pulmonology is next week. 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 This is caused by a genetic mutation in JAK2 gene. This mutation causes the bone marrow to overproduce red blood cells, sometimes, white blood cells, and platelets as well. This can be a cause of elevated Hgb and Hct.  If this test is positive, it does slightly increase her risk for venous thrombosis.  She may require therapeutic phlebotomy and/or medication to lower the red blood cells.  I would also recommend baby aspirin every day to prevent blood clots.  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  Secondary causes include asthma, other cardiopulmonary diseases, sleep apnea, and being in higher elevation.  If this is secondary polycythemia, baby aspirin would still be recommended.  Further evaluation for cause of polycythemia would be needed at that time. Will check labs, CBC/differential, erythropoietin, and JAK2 gene analysis.  Will follow-up with patient via telephone visit in approximately 2 weeks to review and discuss follow-up  plan.

## 2024-02-10 ENCOUNTER — Inpatient Hospital Stay

## 2024-02-10 ENCOUNTER — Other Ambulatory Visit: Payer: Self-pay

## 2024-02-10 DIAGNOSIS — Z803 Family history of malignant neoplasm of breast: Secondary | ICD-10-CM | POA: Diagnosis not present

## 2024-02-10 DIAGNOSIS — J9811 Atelectasis: Secondary | ICD-10-CM | POA: Diagnosis not present

## 2024-02-10 DIAGNOSIS — D751 Secondary polycythemia: Secondary | ICD-10-CM

## 2024-02-10 DIAGNOSIS — D7589 Other specified diseases of blood and blood-forming organs: Secondary | ICD-10-CM | POA: Diagnosis not present

## 2024-02-10 LAB — CBC WITH DIFFERENTIAL (CANCER CENTER ONLY)
Abs Immature Granulocytes: 0.02 K/uL (ref 0.00–0.07)
Basophils Absolute: 0 K/uL (ref 0.0–0.1)
Basophils Relative: 1 %
Eosinophils Absolute: 0.2 K/uL (ref 0.0–0.5)
Eosinophils Relative: 2 %
HCT: 42 % (ref 36.0–46.0)
Hemoglobin: 14.1 g/dL (ref 12.0–15.0)
Immature Granulocytes: 0 %
Lymphocytes Relative: 39 %
Lymphs Abs: 2.5 K/uL (ref 0.7–4.0)
MCH: 28.7 pg (ref 26.0–34.0)
MCHC: 33.6 g/dL (ref 30.0–36.0)
MCV: 85.4 fL (ref 80.0–100.0)
Monocytes Absolute: 0.5 K/uL (ref 0.1–1.0)
Monocytes Relative: 7 %
Neutro Abs: 3.3 K/uL (ref 1.7–7.7)
Neutrophils Relative %: 51 %
Platelet Count: 197 K/uL (ref 150–400)
RBC: 4.92 MIL/uL (ref 3.87–5.11)
RDW: 13.7 % (ref 11.5–15.5)
WBC Count: 6.4 K/uL (ref 4.0–10.5)
nRBC: 0 % (ref 0.0–0.2)

## 2024-02-12 LAB — ERYTHROPOIETIN: Erythropoietin: 7.3 m[IU]/mL (ref 2.6–18.5)

## 2024-02-16 ENCOUNTER — Encounter: Payer: Self-pay | Admitting: Pulmonary Disease

## 2024-02-16 ENCOUNTER — Ambulatory Visit (INDEPENDENT_AMBULATORY_CARE_PROVIDER_SITE_OTHER): Admitting: Pulmonary Disease

## 2024-02-16 ENCOUNTER — Ambulatory Visit

## 2024-02-16 VITALS — BP 108/78 | HR 110 | Temp 97.9°F | Ht 65.0 in | Wt 201.0 lb

## 2024-02-16 DIAGNOSIS — R053 Chronic cough: Secondary | ICD-10-CM | POA: Diagnosis not present

## 2024-02-16 DIAGNOSIS — R059 Cough, unspecified: Secondary | ICD-10-CM

## 2024-02-16 DIAGNOSIS — R0609 Other forms of dyspnea: Secondary | ICD-10-CM | POA: Diagnosis not present

## 2024-02-16 DIAGNOSIS — R0982 Postnasal drip: Secondary | ICD-10-CM

## 2024-02-16 MED ORDER — FLUTICASONE PROPIONATE 50 MCG/ACT NA SUSP: 1.0000 | Freq: Two times a day (BID) | NASAL | 2 refills | Status: AC

## 2024-02-16 MED ORDER — BUDESONIDE-FORMOTEROL FUMARATE 160-4.5 MCG/ACT IN AERO: 2.0000 | INHALATION_SPRAY | Freq: Two times a day (BID) | RESPIRATORY_TRACT | 12 refills | Status: AC

## 2024-02-16 NOTE — Patient Instructions (Signed)
 It is nice to meet you  I think shortness of breath could be related to asthma the history of frequent bronchitis and the albuterol inhaler helping in the past  To help treat this take Symbicort 2 puffs twice a day, every day.  Rinse her mouth thoroughly after every use.  Asthma because of cough, Symbicort might help with this.  Also think postnasal drip or mucus running on the back of your throat from the sinuses is also at play.  Treat this take an antihistamine such as Zyrtec, Xyzal, Allegra, Claritin.  These are the brand names.  Okay to use the generic as well.  In addition, use Flonase 1 spray each nostril twice a day.  I sent a new prescription for this.  If any of the prescribed medication is too expensive, see me a message I will look for a more cost-effective alternative.  Chest x-ray and pulmonary function test for further evaluation.  Return to clinic in 3 months or sooner as needed with Dr. Annella, after pulmonary function test.

## 2024-02-21 NOTE — Assessment & Plan Note (Signed)
 The patient has history of Graves disease, hypothyroid, vitamin D deficiency, osteoporosis, and GERD. She was referred from her endocrinologist. Labs done on 01/04/2024 showed RBC elevated at 5.69, Hgb elevated at 16.3, and Hct elevated at 49.5. The remainder of her blood count with diff was normal. Blood work done on 01/18/2023 showed these values to be within normal limits. A blood count from 07/20/2020 also showed these values to be normal.  The patient reports history of shortness of breath and wheezing for several years. She had a calcium scoring test done last year due to the symptoms of heaviness in her chest. She was noted to have mild bibasilar atelectasis. CT renal stone study from 2022 also showed bibasilar atelectasis.  Of note, her endocrinologist also referred her to pulmonology.  Initial appointment with pulmonology is next week. 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 This is caused by a genetic mutation in JAK2 gene. This mutation causes the bone marrow to overproduce red blood cells, sometimes, white blood cells, and platelets as well. This can be a cause of elevated Hgb and Hct.  If this test is positive, it does slightly increase her risk for venous thrombosis.  She may require therapeutic phlebotomy and/or medication to lower the red blood cells.  I would also recommend baby aspirin every day to prevent blood clots.  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  Secondary causes include asthma, other cardiopulmonary diseases, sleep apnea, and being in higher elevation.  If this is secondary polycythemia, baby aspirin would still be recommended.  Further evaluation for cause of polycythemia would be needed at that time. Will check labs, CBC/differential, erythropoietin, and JAK2 gene analysis.  Will follow-up with patient via telephone visit in approximately 2 weeks to review and discuss follow-up  plan.

## 2024-02-21 NOTE — Progress Notes (Unsigned)
 Patient Care Team: Tommas Pears, MD as PCP - General (Endocrinology)  Clinic Day:  02/24/2024  Referring physician: Tommas Pears, MD  ASSESSMENT & PLAN:   Assessment & Plan: Erythrocytosis  The patient has history of Graves disease, hypothyroid, vitamin D deficiency, osteoporosis, and GERD. She was referred from her endocrinologist. Labs done on 01/04/2024 showed RBC elevated at 5.69, Hgb elevated at 16.3, and Hct elevated at 49.5. The remainder of her blood count with diff was normal. Blood work done on 01/18/2023 showed these values to be within normal limits. A blood count from 07/20/2020 also showed these values to be normal.  The patient reports history of shortness of breath and wheezing for several years. She had a calcium scoring test done last year due to the symptoms of heaviness in her chest. She was noted to have mild bibasilar atelectasis. CT renal stone study from 2022 also showed bibasilar atelectasis.  Of note, her endocrinologist also referred her to pulmonology.  Initial appointment with pulmonology is next week. 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 This is caused by a genetic mutation in JAK2 gene. This mutation causes the bone marrow to overproduce red blood cells, sometimes, white blood cells, and platelets as well. This can be a cause of elevated Hgb and Hct.  If this test is positive, it does slightly increase her risk for venous thrombosis.  She may require therapeutic phlebotomy and/or medication to lower the red blood cells.  I would also recommend baby aspirin every day to prevent blood clots.  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  Secondary causes include asthma, other cardiopulmonary diseases, sleep apnea, and being in higher elevation.  If this is secondary polycythemia, baby aspirin would still be recommended.  Further evaluation for cause of polycythemia would be  needed at that time. Will check labs, CBC/differential, erythropoietin , and JAK2 gene analysis.  Will follow-up with patient via telephone visit in approximately 2 weeks to review and discuss follow-up plan. 02/24/2024 - - CBC and erythropoietin  levels were within normal limits. Testing for JAK2/BCL was negative for genetic mutations related to polycythemia vera. 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 She has seen pulmonary and new inhalers/medications have been prescribed.  Follow-up is due in 3 months with them. Will plan to see her back on an as-needed basis.  Patient voices understanding and agreement with this plan.   I connected with Brianna Austin on 02/24/2024 at  9:00 AM EST by telephone and verified that I am speaking with the correct person using two identifiers.   I discussed the limitations, risks, security and privacy concerns of performing an evaluation and management service by telemedicine and the availability of in-person appointments. I also discussed with the patient that there may be a patient responsible charge related to this service. The patient expressed understanding and agreed to proceed.   Other persons participating in the visit and their role in the encounter: n/a   Patients location: home   Providers location: [facility]    Chief Complaint: Polycythemia  Plan Labs reviewed. - CBC and erythropoietin  levels were within normal limits. - JAK2/BCL was negative for genetic mutations related to polycythemia. 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 - She has seen pulmonary and new inhalers/medications have been prescribed.  Follow-up is due in 3 months with them. Will plan  to see her back on an as-needed basis.  Patient voices understanding and agreement with this plan.  The patient understands the plans discussed today and is in agreement with them.  She knows  to contact our office if she develops concerns prior to her next appointment.  I provided 10 minutes of face-to-face time during this encounter and > 50% was spent counseling as documented under my assessment and plan.    Powell FORBES Lessen, NP  [facility] - A DEPT OF [facility] [address] [place] [address] Dept: [phone] Dept Fax: [phone]   No orders of the defined types were placed in this encounter.     CHIEF COMPLAINT:  CC: Erythrocytosis  Current Treatment: Observation  INTERVAL HISTORY:  Brianna Austin is here today for repeat clinical assessment.  Her initial visit with me was 02/09/2024.  Blood work done on that day was within normal limits.  Awaiting results of JAK2/BCL genetic testing results.  Lab work done on that day showed CBC had recovered to normal levels.  JAK2/BCL genetic testing results were negative for genetic mutations associated with polycythemia vera.  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 She has seen pulmonology and is being treated for moderate asthma.  She just started new medications for her asthma this week.  Thus far, has not noticed a difference, but is hopeful.  She denies new symptoms or concerns.  She denies chest pain, chest pressure, or new or unusual shortness of breath. She denies headaches or visual disturbances. He denies abdominal pain, nausea, vomiting, or changes in bowel or bladder habits.  She denies fevers or chills. She denies pain. Her appetite is good.  I have reviewed the past medical history, past surgical history, social history and family history with the patient and they are unchanged from previous note.  ALLERGIES:  is allergic to fosamax [alendronate sodium], ambien [zolpidem tartrate], ibandronate, other, and zolpidem.  MEDICATIONS:  Current Outpatient Medications  Medication Sig  Dispense Refill   Boswellia-Glucosamine-Vit D (OSTEO BI-FLEX ONE PER DAY PO) Take by mouth.     budesonide -formoterol  (SYMBICORT ) 160-4.5 MCG/ACT inhaler Inhale 2 puffs into the lungs 2 (two) times daily. 1 each 12   Cholecalciferol (DIALYVITE VITAMIN D 5000 PO) Take by mouth daily.     cyclobenzaprine (FLEXERIL) 10 MG tablet Take 10 mg by mouth 3 (three) times daily as needed for muscle spasms. (Patient taking differently: Take 5 mg by mouth 3 (three) times daily as needed for muscle spasms.)     fluticasone  (FLONASE ) 50 MCG/ACT nasal spray Place 1 spray into both nostrils 2 (two) times daily. 16 g 2   guaiFENesin (MUCINEX) 600 MG 12 hr tablet Take by mouth as needed.     hydrochlorothiazide (MICROZIDE) 12.5 MG capsule Take 12.5 mg by mouth daily. 1200 pm     ketorolac  (TORADOL ) 10 MG tablet Take 1 tablet (10 mg total) by mouth every 8 (eight) hours as needed for moderate pain. Or stent discomfort post-operatively. (Patient not taking: Reported on 02/16/2024) 20 tablet 0   levothyroxine (SYNTHROID, LEVOTHROID) 100 MCG tablet 100 mcg at bedtime.  3   Multiple Vitamin (MULTIVITAMIN) tablet Take 1 tablet by mouth daily. One a day women's over 50 mvi     omeprazole (PRILOSEC) 40 MG capsule Take 40 mg by mouth daily.     OVER THE COUNTER MEDICATION Ultra  flora probiotic daily     senna-docusate (SENOKOT-S) 8.6-50 MG tablet Take 1 tablet by mouth 2 (two) times daily. While taking strongest pain meds to prevent constipation. (Patient not taking: Reported on 02/16/2024) 10 tablet 0   venlafaxine XR (EFFEXOR-XR) 75 MG 24 hr capsule at bedtime. For hot flashes (Patient not taking: Reported on 02/16/2024)  2   No current facility-administered medications for this visit.    REVIEW OF SYSTEMS:   Constitutional: Denies fevers, chills or abnormal weight loss Eyes: Denies blurriness of vision Ears, nose, mouth, throat, and face: Denies mucositis or sore throat Respiratory: Has wheezing and shortness of  breath, likely due to asthma. Cardiovascular: Denies palpitation, chest discomfort or lower extremity swelling Gastrointestinal:  Denies nausea, heartburn or change in bowel habits Skin: Denies abnormal skin rashes Lymphatics: Denies new lymphadenopathy or easy bruising Neurological:Denies numbness, tingling or new weaknesses Behavioral/Psych: Mood is stable, no new changes  All other systems were reviewed with the patient and are negative.   VITALS:  There were no vitals taken for this visit.  Wt Readings from Last 3 Encounters:  02/16/24 201 lb (91.2 kg)  02/09/24 199 lb 12.8 oz (90.6 kg)  07/19/20 180 lb (81.6 kg)    There is no height or weight on file to calculate BMI.  Performance status (ECOG): 1 - Symptomatic but completely ambulatory  LABORATORY DATA:  I have reviewed the data as listed    Component Value Date/Time   NA 136 07/20/2020 0521   K 4.3 07/20/2020 0521   CL 101 07/20/2020 0521   CO2 24 07/20/2020 0521   GLUCOSE 120 (H) 07/20/2020 0521   BUN 29 (H) 07/20/2020 0521   CREATININE 1.49 (H) 07/20/2020 0521   CALCIUM 8.7 (L) 07/20/2020 0521   GFRNONAA 42 (L) 07/20/2020 0521    Lab Results  Component Value Date   WBC 6.4 02/10/2024   NEUTROABS 3.3 02/10/2024   HGB 14.1 02/10/2024   HCT 42.0 02/10/2024   MCV 85.4 02/10/2024   PLT 197 02/10/2024

## 2024-02-23 LAB — CALR +MPL + E12-E15  (REFLEX)

## 2024-02-23 LAB — JAK2 V617F RFX CALR/MPL/E12-15

## 2024-02-24 ENCOUNTER — Inpatient Hospital Stay: Admitting: Nurse Practitioner

## 2024-02-24 ENCOUNTER — Encounter: Payer: Self-pay | Admitting: Nurse Practitioner

## 2024-02-24 DIAGNOSIS — D751 Secondary polycythemia: Secondary | ICD-10-CM

## 2024-03-08 NOTE — Progress Notes (Signed)
 "  @Patient  ID: Brianna Austin, female    DOB: 09/05/1970, 53 y.o.   MRN: [id number]  Chief Complaint  Patient presents with   Consult    SOB, prod cough with yellow sputum occ mixed with blood and wheezing     Referring provider: Tommas Pears, MD  HPI:   53 y.o. woman whom we are seeing for evaluation of dyspnea on exertion.  Most recent hematology note reviewed.  Patient did dyspnea on exertion for some time.  Please few months.  Part of the workup included blood work.  Noted to have polycythemia.  Repeat blood work showed normal hemoglobin.  Dyspnea exertion worse on inclines or stairs.  No time of day with things up or worse.  No position makes it better or worse.  No seasonal or environmental factors she can identify that make things better or worse.  No recent chest imaging.  Head CT coronary scan 02/2023 lumbar review interpretation reveals clear lungs on limited views of lung, not full lung pictured, also small hiatal hernia.  On review of system she does endorsed frequent bronchitis.  At least once a year.  Every January.  This was years ago.  Seems less frequent recently.   Questionaires / Pulmonary Flowsheets:   ACT:      No data to display          MMRC:     No data to display          Epworth:      No data to display          Tests:   FENO:  No results found for: NITRICOXIDE  PFT:     No data to display          WALK:      No data to display          Imaging: Personally viewed and as per EMR and discussion in this note  Lab Results: Personally reviewed CBC    Component Value Date/Time   WBC 6.4 02/10/2024 1013   WBC 11.0 (H) 07/20/2020 0521   RBC 4.92 02/10/2024 1013   HGB 14.1 02/10/2024 1013   HCT 42.0 02/10/2024 1013   PLT 197 02/10/2024 1013   MCV 85.4 02/10/2024 1013   MCH 28.7 02/10/2024 1013   MCHC 33.6 02/10/2024 1013   RDW 13.7 02/10/2024 1013   LYMPHSABS 2.5 02/10/2024 1013   MONOABS 0.5 02/10/2024 1013    EOSABS 0.2 02/10/2024 1013   BASOSABS 0.0 02/10/2024 1013    BMET    Component Value Date/Time   NA 136 07/20/2020 0521   K 4.3 07/20/2020 0521   CL 101 07/20/2020 0521   CO2 24 07/20/2020 0521   GLUCOSE 120 (H) 07/20/2020 0521   BUN 29 (H) 07/20/2020 0521   CREATININE 1.49 (H) 07/20/2020 0521   CALCIUM 8.7 (L) 07/20/2020 0521   GFRNONAA 42 (L) 07/20/2020 0521    BNP No results found for: BNP  ProBNP No results found for: PROBNP  Specialty Problems   None   Allergies[1]   There is no immunization history on file for this patient.  Past Medical History:  Diagnosis Date   Anemia 16-20 yrs ago   Arthritis    oa   Asthma    mild   Dysphagia    Fracture of unspecified metatarsal bone(s), right foot, initial encounter for closed fracture    fx x 3 per pt   GERD (gastroesophageal reflux disease)    Graves  disease    Headache    History of chest pain 2020   no cause found was told was reflux and stress none since   History of hiatal hernia    History of kidney stones April 06, 1999   passed on own   Hyperthyroidism    Hypothyroidism    IBS (irritable bowel syndrome)    Left ureteral stone    Neuromuscular disorder (HCC)    top of left finger index  does not bend   Osteoporosis    Patella-femoral syndrome    both knees   TMJ click    Umbilical hernia    Venous stasis    both legs right worse than left wears compression stocking for   Wears glasses    for reading    Tobacco History: Tobacco Use History[2] Counseling given: Not Answered   Continue to not smoke  Outpatient Encounter Medications as of 02/16/2024  Medication Sig   Boswellia-Glucosamine-Vit D (OSTEO BI-FLEX ONE PER DAY PO) Take by mouth.   budesonide -formoterol  (SYMBICORT ) 160-4.5 MCG/ACT inhaler Inhale 2 puffs into the lungs 2 (two) times daily.   Cholecalciferol (DIALYVITE VITAMIN D 5000 PO) Take by mouth daily.   cyclobenzaprine (FLEXERIL) 10 MG tablet Take 10 mg by mouth 3 (three) times  daily as needed for muscle spasms. (Patient taking differently: Take 5 mg by mouth 3 (three) times daily as needed for muscle spasms.)   fluticasone  (FLONASE ) 50 MCG/ACT nasal spray Place 1 spray into both nostrils 2 (two) times daily.   guaiFENesin (MUCINEX) 600 MG 12 hr tablet Take by mouth as needed.   hydrochlorothiazide (MICROZIDE) 12.5 MG capsule Take 12.5 mg by mouth daily. 1200 pm   levothyroxine (SYNTHROID, LEVOTHROID) 100 MCG tablet 100 mcg at bedtime.   Multiple Vitamin (MULTIVITAMIN) tablet Take 1 tablet by mouth daily. One a day women's over 50 mvi   omeprazole (PRILOSEC) 40 MG capsule Take 40 mg by mouth daily.   OVER THE COUNTER MEDICATION Ultra flora probiotic daily   ketorolac  (TORADOL ) 10 MG tablet Take 1 tablet (10 mg total) by mouth every 8 (eight) hours as needed for moderate pain. Or stent discomfort post-operatively. (Patient not taking: Reported on 02/16/2024)   senna-docusate (SENOKOT-S) 8.6-50 MG tablet Take 1 tablet by mouth 2 (two) times daily. While taking strongest pain meds to prevent constipation. (Patient not taking: Reported on 02/16/2024)   venlafaxine XR (EFFEXOR-XR) 75 MG 24 hr capsule at bedtime. For hot flashes (Patient not taking: Reported on 02/16/2024)   No facility-administered encounter medications on file as of 02/16/2024.     Review of Systems  Review of Systems  Comprehensive review systems negative unless mentioned in HPI above Physical Exam  BP 108/78   Pulse (!) 110   Temp 97.9 F (36.6 C) (Oral)   Ht 5' 5 (1.651 m)   Wt 201 lb (91.2 kg)   SpO2 94%   BMI 33.45 kg/m   Wt Readings from Last 5 Encounters:  02/16/24 201 lb (91.2 kg)  02/09/24 199 lb 12.8 oz (90.6 kg)  07/19/20 180 lb (81.6 kg)  07/19/20 189 lb 8 oz (86 kg)  02/14/18 178 lb (80.7 kg)    BMI Readings from Last 5 Encounters:  02/16/24 33.45 kg/m  02/09/24 33.25 kg/m  07/19/20 29.95 kg/m  07/19/20 31.53 kg/m  02/14/18 29.62 kg/m     Physical  Exam General: Sitting in chair, distress Eyes: EOMI Neck: No JVP Pulmonary: Clear, no work of breathing Cardiovascular warm, no edema Abdomen: Nondistended  MSK: No synovitis, no joint effusion Neuro: Normal gait, no weakness   Assessment & Plan:   Dyspnea on exertion: Concern for small disease or asthma.  PFTs ordered for further evaluation.  Chest x-ray for further evaluation.  Dyspnea exertion, frequent bronchitis, cough: Correlation of symptoms could be sign of asthma.  Symbicort  prescribed.  Notably, eosinophils 200 on review of most recent labs.  Postnasal drip: Could be contributing to cough, frequent exacerbations.  Recommend antihistamine, prescription for Flonase  as well.  Assess response.   Return in about 3 months (around 05/16/2024) for f/u Dr. Annella.   Brianna JONELLE Annella, MD 03/08/2024      [1]  Allergies Allergen Reactions   Fosamax [Alendronate Sodium] Anaphylaxis   Ambien [Zolpidem Tartrate] Other (See Comments)    Hallucinations/vomiting   Ibandronate     Other reaction(s): anaphylaxis   Other     Other reaction(s): violent vomitting   Zolpidem Nausea And Vomiting  [2]  Social History Tobacco Use  Smoking Status Never  Smokeless Tobacco Never   "

## 2024-05-18 ENCOUNTER — Encounter

## 2024-05-18 ENCOUNTER — Ambulatory Visit: Admitting: Pulmonary Disease
